# Patient Record
Sex: Female | Born: 1960 | Race: White | Hispanic: No | Smoking: Current every day smoker
Health system: Southern US, Community
[De-identification: ages and names within clinical notes are randomized; demographics above are authoritative.]

## PROBLEM LIST (undated history)

## (undated) DIAGNOSIS — T7840XA Allergy, unspecified, initial encounter: Secondary | ICD-10-CM

## (undated) DIAGNOSIS — D510 Vitamin B12 deficiency anemia due to intrinsic factor deficiency: Secondary | ICD-10-CM

## (undated) DIAGNOSIS — J302 Other seasonal allergic rhinitis: Secondary | ICD-10-CM

## (undated) DIAGNOSIS — C4491 Basal cell carcinoma of skin, unspecified: Secondary | ICD-10-CM

## (undated) HISTORY — PX: MOHS SURGERY: SHX181

## (undated) HISTORY — DX: Basal cell carcinoma of skin, unspecified: C44.91

## (undated) HISTORY — DX: Other seasonal allergic rhinitis: J30.2

## (undated) HISTORY — DX: Vitamin B12 deficiency anemia due to intrinsic factor deficiency: D51.0

## (undated) HISTORY — DX: Allergy, unspecified, initial encounter: T78.40XA

## (undated) HISTORY — PX: CHOLECYSTECTOMY: SHX55

---

## 2002-01-08 ENCOUNTER — Other Ambulatory Visit: Admission: RE | Admit: 2002-01-08 | Discharge: 2002-01-08 | Payer: Self-pay | Admitting: Obstetrics and Gynecology

## 2003-01-07 ENCOUNTER — Other Ambulatory Visit: Admission: RE | Admit: 2003-01-07 | Discharge: 2003-01-07 | Payer: Self-pay | Admitting: Internal Medicine

## 2003-01-20 ENCOUNTER — Encounter: Admission: RE | Admit: 2003-01-20 | Discharge: 2003-01-20 | Payer: Self-pay | Admitting: Internal Medicine

## 2003-01-20 ENCOUNTER — Encounter: Payer: Self-pay | Admitting: Internal Medicine

## 2004-08-04 ENCOUNTER — Ambulatory Visit: Payer: Self-pay | Admitting: Internal Medicine

## 2004-08-05 ENCOUNTER — Ambulatory Visit: Payer: Self-pay | Admitting: *Deleted

## 2004-08-11 ENCOUNTER — Ambulatory Visit (HOSPITAL_COMMUNITY): Admission: RE | Admit: 2004-08-11 | Discharge: 2004-08-11 | Payer: Self-pay | Admitting: Family Medicine

## 2004-08-26 ENCOUNTER — Encounter: Admission: RE | Admit: 2004-08-26 | Discharge: 2004-08-26 | Payer: Self-pay | Admitting: Family Medicine

## 2004-08-26 ENCOUNTER — Ambulatory Visit: Payer: Self-pay | Admitting: Internal Medicine

## 2004-09-05 ENCOUNTER — Ambulatory Visit (HOSPITAL_COMMUNITY): Admission: RE | Admit: 2004-09-05 | Discharge: 2004-09-05 | Payer: Self-pay | Admitting: Internal Medicine

## 2004-09-05 ENCOUNTER — Ambulatory Visit: Payer: Self-pay | Admitting: Internal Medicine

## 2004-09-13 ENCOUNTER — Encounter (INDEPENDENT_AMBULATORY_CARE_PROVIDER_SITE_OTHER): Payer: Self-pay | Admitting: *Deleted

## 2004-09-13 ENCOUNTER — Ambulatory Visit: Payer: Self-pay | Admitting: *Deleted

## 2004-09-14 ENCOUNTER — Ambulatory Visit: Payer: Self-pay | Admitting: Internal Medicine

## 2004-09-22 ENCOUNTER — Ambulatory Visit: Payer: Self-pay | Admitting: Obstetrics and Gynecology

## 2006-04-06 ENCOUNTER — Encounter: Admission: RE | Admit: 2006-04-06 | Discharge: 2006-04-06 | Payer: Self-pay | Admitting: Obstetrics and Gynecology

## 2006-08-20 ENCOUNTER — Encounter: Admission: RE | Admit: 2006-08-20 | Discharge: 2006-08-20 | Payer: Self-pay | Admitting: Obstetrics and Gynecology

## 2007-03-26 ENCOUNTER — Encounter: Admission: RE | Admit: 2007-03-26 | Discharge: 2007-03-26 | Payer: Self-pay | Admitting: Obstetrics and Gynecology

## 2007-10-02 ENCOUNTER — Ambulatory Visit: Payer: Self-pay | Admitting: Internal Medicine

## 2007-10-24 ENCOUNTER — Ambulatory Visit (HOSPITAL_COMMUNITY): Admission: RE | Admit: 2007-10-24 | Discharge: 2007-10-24 | Payer: Self-pay | Admitting: Internal Medicine

## 2007-11-25 ENCOUNTER — Encounter: Admission: RE | Admit: 2007-11-25 | Discharge: 2007-11-25 | Payer: Self-pay | Admitting: Family Medicine

## 2008-04-07 ENCOUNTER — Encounter: Admission: RE | Admit: 2008-04-07 | Discharge: 2008-04-07 | Payer: Self-pay | Admitting: Obstetrics and Gynecology

## 2008-06-05 HISTORY — PX: BREAST BIOPSY: SHX20

## 2009-04-08 ENCOUNTER — Encounter: Admission: RE | Admit: 2009-04-08 | Discharge: 2009-04-08 | Payer: Self-pay | Admitting: Obstetrics and Gynecology

## 2009-04-13 ENCOUNTER — Encounter: Admission: RE | Admit: 2009-04-13 | Discharge: 2009-04-13 | Payer: Self-pay | Admitting: Obstetrics and Gynecology

## 2009-07-01 ENCOUNTER — Ambulatory Visit: Payer: Self-pay | Admitting: Internal Medicine

## 2009-07-05 LAB — CBC & DIFF AND RETIC
BASO%: 0.3 % (ref 0.0–2.0)
Eosinophils Absolute: 0.2 10*3/uL (ref 0.0–0.5)
LYMPH%: 35.6 % (ref 14.0–49.7)
MCHC: 33.5 g/dL (ref 31.5–36.0)
MCV: 101 fL (ref 79.5–101.0)
MONO%: 5.6 % (ref 0.0–14.0)
NEUT#: 3.3 10*3/uL (ref 1.5–6.5)
RBC: 3.81 10*6/uL (ref 3.70–5.45)
RDW: 12.9 % (ref 11.2–14.5)
Retic %: 0.7 % (ref 0.50–1.50)
Retic Ct Abs: 26.67 10*3/uL (ref 18.30–72.70)
WBC: 5.9 10*3/uL (ref 3.9–10.3)

## 2009-07-05 LAB — IRON AND TIBC: UIBC: 169 ug/dL

## 2009-07-05 LAB — FERRITIN: Ferritin: 22 ng/mL (ref 10–291)

## 2009-07-05 LAB — VITAMIN B12: Vitamin B-12: 972 pg/mL — ABNORMAL HIGH (ref 211–911)

## 2009-07-05 LAB — FOLATE: Folate: 15.6 ng/mL

## 2009-10-29 ENCOUNTER — Encounter: Admission: RE | Admit: 2009-10-29 | Discharge: 2009-10-29 | Payer: Self-pay | Admitting: Obstetrics and Gynecology

## 2010-05-20 ENCOUNTER — Encounter
Admission: RE | Admit: 2010-05-20 | Discharge: 2010-05-20 | Payer: Self-pay | Source: Home / Self Care | Attending: Obstetrics and Gynecology | Admitting: Obstetrics and Gynecology

## 2010-06-26 ENCOUNTER — Encounter: Payer: Self-pay | Admitting: Obstetrics and Gynecology

## 2010-06-26 ENCOUNTER — Encounter: Payer: Self-pay | Admitting: Family Medicine

## 2010-08-11 ENCOUNTER — Other Ambulatory Visit: Payer: Self-pay | Admitting: Dermatology

## 2010-10-21 NOTE — Group Therapy Note (Signed)
NAMEKIANTE, PETROVICH NO.:  [id number]   MEDICAL RECORD NO.:  [id number]          PATIENT TYPE:  WOC   LOCATION:  [facility]                   FACILITY:  [facility]   PHYSICIAN:  Ellis Parents, MD    DATE OF BIRTH:  [birth year]   DATE OF SERVICE:  09/13/2004                                    CLINIC NOTE   NOTE:  This 50 year old gravida 2, para 2 with last menstrual period on  August 28, 2004, comes in to have an IDU removed and a new one reinserted,  and for a Pap smear.  The patient's menses are regular, lasting  approximately six days and a normal flow.  She has no gynecological  complaints.  She has had the IUD in for 10 years, and has had no difficulty.   She had a mammogram approximately one month ago.   PHYSICAL EXAMINATION:  PELVIC:  The external genitalia are normal.  The  vagina is clean.  The cervix is well-epithelialized and appears normal.  The  IUD thread is seen protruding approximately 3 cm.  The uterus is in mid-  position, normal in size and mobile.  Both adnexa are soft, without any  evidence of pelvic masses or nodularity.  A Pap smear is performed.   The patient was advised to return in the onset and during the duration of  the next menstrual period for the IUD to be removed and an Arena to be  inserted.      SA/MEDQ  D:  09/13/2004  T:  09/13/2004  Job:  [id number]

## 2011-05-24 ENCOUNTER — Other Ambulatory Visit: Payer: Self-pay | Admitting: Obstetrics and Gynecology

## 2011-06-25 ENCOUNTER — Encounter (INDEPENDENT_AMBULATORY_CARE_PROVIDER_SITE_OTHER): Payer: BC Managed Care – PPO | Admitting: Family Medicine

## 2011-06-25 DIAGNOSIS — M25579 Pain in unspecified ankle and joints of unspecified foot: Secondary | ICD-10-CM

## 2011-06-25 DIAGNOSIS — I10 Essential (primary) hypertension: Secondary | ICD-10-CM

## 2011-06-25 DIAGNOSIS — R5382 Chronic fatigue, unspecified: Secondary | ICD-10-CM

## 2011-06-27 ENCOUNTER — Encounter: Payer: Self-pay | Admitting: Family Medicine

## 2011-06-27 DIAGNOSIS — D510 Vitamin B12 deficiency anemia due to intrinsic factor deficiency: Secondary | ICD-10-CM | POA: Insufficient documentation

## 2011-06-27 DIAGNOSIS — C4491 Basal cell carcinoma of skin, unspecified: Secondary | ICD-10-CM | POA: Insufficient documentation

## 2011-06-27 DIAGNOSIS — J302 Other seasonal allergic rhinitis: Secondary | ICD-10-CM | POA: Insufficient documentation

## 2011-06-29 NOTE — Progress Notes (Deleted)
  Subjective:    Patient ID: Renee Anderson, female    DOB: 04-25-1961, 51 y.o.   MRN: [id number]  HPI    Review of Systems     Objective:   Physical Exam  Constitutional: She appears well-developed.  Pulmonary/Chest: Effort normal.  Skin: Skin is warm.          Assessment & Plan:

## 2011-07-07 ENCOUNTER — Other Ambulatory Visit: Payer: Self-pay | Admitting: Obstetrics and Gynecology

## 2011-07-07 DIAGNOSIS — Z1231 Encounter for screening mammogram for malignant neoplasm of breast: Secondary | ICD-10-CM

## 2011-07-21 ENCOUNTER — Ambulatory Visit
Admission: RE | Admit: 2011-07-21 | Discharge: 2011-07-21 | Disposition: A | Discharge status: Home / Self Care | Payer: Self-pay | Source: Ambulatory Visit | Attending: Obstetrics and Gynecology | Admitting: Obstetrics and Gynecology

## 2011-07-21 DIAGNOSIS — Z1231 Encounter for screening mammogram for malignant neoplasm of breast: Secondary | ICD-10-CM

## 2011-07-26 NOTE — Progress Notes (Signed)
This encounter was created in error - please disregard.

## 2011-10-10 ENCOUNTER — Ambulatory Visit (INDEPENDENT_AMBULATORY_CARE_PROVIDER_SITE_OTHER): Payer: BC Managed Care – PPO | Admitting: Family Medicine

## 2011-10-10 ENCOUNTER — Other Ambulatory Visit: Payer: Self-pay | Admitting: Family Medicine

## 2011-10-10 VITALS — BP 103/65 | HR 66 | Temp 98.0°F | Resp 16 | Ht 65.5 in | Wt 177.0 lb

## 2011-10-10 DIAGNOSIS — D510 Vitamin B12 deficiency anemia due to intrinsic factor deficiency: Secondary | ICD-10-CM

## 2011-10-10 DIAGNOSIS — M545 Low back pain: Secondary | ICD-10-CM

## 2011-10-10 LAB — POCT URINALYSIS DIPSTICK
Leukocytes, UA: NEGATIVE
Nitrite, UA: NEGATIVE
Protein, UA: NEGATIVE
Urobilinogen, UA: 0.2
pH, UA: 6

## 2011-10-10 LAB — POCT UA - MICROSCOPIC ONLY
Casts, Ur, LPF, POC: NEGATIVE
Mucus, UA: NEGATIVE

## 2011-10-10 MED ORDER — CYANOCOBALAMIN 1000 MCG/ML IJ SOLN
1000.0000 ug | Freq: Once | INTRAMUSCULAR | Status: AC
  Administered 2011-10-10: 1000 ug via INTRAMUSCULAR

## 2011-10-10 NOTE — Progress Notes (Signed)
  Subjective:    Patient ID: Renee Anderson, female    DOB: [birth year], 51 y.o.   MRN: [id number]  HPI  Patient presents complaining of LBP that is positional in nature. Denies radiation to LE or change in bowel or bladder function  Patient would like urine checked as she's concerned she may have a UTI. No genitourinary symptoms  Requests monthly B12 injection; known pernicious anemia  Review of Systems     Objective:   Physical Exam  Constitutional: She appears well-developed.  Cardiovascular: Normal rate, regular rhythm and normal heart sounds.   Pulmonary/Chest: Effort normal and breath sounds normal.  Abdominal: Soft. Bowel sounds are normal. There is no tenderness.  Musculoskeletal:       Lumbar back: She exhibits tenderness and spasm. She exhibits normal range of motion, no bony tenderness and no pain (neg SLR).  Skin: Skin is warm.     Results for orders placed in visit on 10/10/11  POCT UA - MICROSCOPIC ONLY      Component Value Range   WBC, Ur, HPF, POC 1-5     RBC, urine, microscopic neg     Bacteria, U Microscopic 1+     Mucus, UA neg     Epithelial cells, urine per micros 0-4     Crystals, Ur, HPF, POC neg     Casts, Ur, LPF, POC neg     Yeast, UA neg    POCT URINALYSIS DIPSTICK      Component Value Range   Color, UA yellow     Clarity, UA clear     Glucose, UA neg     Bilirubin, UA neg     Ketones, UA neg     Spec Grav, UA 1.020     Blood, UA neg     pH, UA 6.0     Protein, UA neg     Urobilinogen, UA 0.2     Nitrite, UA neg     Leukocytes, UA Negative          Assessment & Plan:   1. Low back pain  POCT UA - Microscopic Only, POCT Urinalysis Dipstick, Urine culture  2. Pernicious anemia  cyanocobalamin ((VITAMIN B-12)) injection 1,000 mcg    Mobic 7.5 mg BID; has Rx at home Anticipatory guidance

## 2011-10-24 ENCOUNTER — Other Ambulatory Visit: Payer: Self-pay | Admitting: Family Medicine

## 2011-12-22 ENCOUNTER — Other Ambulatory Visit: Payer: Self-pay | Admitting: Obstetrics and Gynecology

## 2011-12-22 DIAGNOSIS — N63 Unspecified lump in unspecified breast: Secondary | ICD-10-CM

## 2011-12-27 ENCOUNTER — Ambulatory Visit
Admission: RE | Admit: 2011-12-27 | Discharge: 2011-12-27 | Disposition: A | Discharge status: Home / Self Care | Payer: BC Managed Care – PPO | Source: Ambulatory Visit | Attending: Obstetrics and Gynecology | Admitting: Obstetrics and Gynecology

## 2011-12-27 DIAGNOSIS — N63 Unspecified lump in unspecified breast: Secondary | ICD-10-CM

## 2012-01-25 ENCOUNTER — Other Ambulatory Visit: Payer: Self-pay | Admitting: Family Medicine

## 2012-01-25 NOTE — Telephone Encounter (Signed)
Patient's chart is at the nurses station in the pa pool pile. °

## 2012-02-26 ENCOUNTER — Ambulatory Visit (INDEPENDENT_AMBULATORY_CARE_PROVIDER_SITE_OTHER): Payer: BC Managed Care – PPO | Admitting: Family Medicine

## 2012-02-26 VITALS — BP 136/78 | HR 79 | Temp 97.6°F | Resp 16 | Ht 64.0 in | Wt 171.8 lb

## 2012-02-26 DIAGNOSIS — J3489 Other specified disorders of nose and nasal sinuses: Secondary | ICD-10-CM

## 2012-02-26 DIAGNOSIS — R51 Headache: Secondary | ICD-10-CM

## 2012-02-26 MED ORDER — AZITHROMYCIN 250 MG PO TABS: ORAL_TABLET | ORAL | Status: DC

## 2012-02-26 NOTE — Progress Notes (Signed)
  Subjective:    Patient ID: Renee Anderson, female    DOB: 19-Sep-1960, 51 y.o.   MRN: [id number]  HPI Renee Anderson is a 51 y.o. female Started with congestion fatigue 2 days ago.  Headache yesterday behind L eye. Worse today. Headache back and forth.  Tried zyrtec and ibuprofen today - zyrtec as needed only in past.    No fever.  No congestion yet, no sore throat, but feels pnd.  No chest tightness or cough "yet".    Review of Systems  Constitutional: Negative for fever and chills.  HENT: Positive for sinus pressure.   Respiratory: Negative for cough and shortness of breath.   Cardiovascular: Negative for chest pain.  Neurological: Positive for headaches.       Objective:   Physical Exam  Constitutional: She is oriented to person, place, and time. She appears well-developed and well-nourished. No distress.  HENT:  Head: Normocephalic and atraumatic.  Right Ear: Hearing, tympanic membrane, external ear and ear canal normal.  Left Ear: Hearing, tympanic membrane, external ear and ear canal normal.  Nose: Nose normal.  Mouth/Throat: Oropharynx is clear and moist. No oropharyngeal exudate.  Eyes: Conjunctivae normal and EOM are normal. Pupils are equal, round, and reactive to light.  Cardiovascular: Normal rate, regular rhythm, normal heart sounds and intact distal pulses.   No murmur heard. Pulmonary/Chest: Effort normal and breath sounds normal. No respiratory distress. She has no wheezes. She has no rhonchi.  Neurological: She is alert and oriented to person, place, and time.  Skin: Skin is warm and dry. No rash noted.  Psychiatric: She has a normal mood and affect. Her behavior is normal.       Assessment & Plan:  Renee Anderson is a 51 y.o. female 1. Headache  azithromycin (ZITHROMAX) 250 MG tablet  2. Sinus pain  azithromycin (ZITHROMAX) 250 MG tablet   Discussed likely early viral uri, sinus pressure.  Z pak requested, discussed use and indications for  antiobiotics, and unlikely sinus infection at this point.  Can continue zyrtec, start saline ns, and if not improving this week - start z pak.  If any worsening - rtc.  rtc precautions discussed.

## 2012-02-26 NOTE — Patient Instructions (Signed)
Saline nasal spray atleast 4 times per day,  drink plenty of fluids. If not improving this week, can fill z pak prescription. Return to the clinic or go to the nearest emergency room if any of your symptoms worsen or new symptoms occur.

## 2012-04-25 ENCOUNTER — Other Ambulatory Visit: Payer: Self-pay | Admitting: *Deleted

## 2012-04-25 NOTE — Telephone Encounter (Signed)
Pharmacy requesting refills on b12.  Last filled on 01/25/12

## 2012-04-30 ENCOUNTER — Other Ambulatory Visit: Payer: Self-pay | Admitting: Physician Assistant

## 2012-04-30 MED ORDER — CYANOCOBALAMIN 1000 MCG/ML IJ SOLN: 1000.0000 ug | INTRAMUSCULAR | Status: AC

## 2012-05-20 ENCOUNTER — Other Ambulatory Visit: Payer: Self-pay | Admitting: Radiology

## 2012-07-22 ENCOUNTER — Other Ambulatory Visit: Payer: Self-pay | Admitting: Dermatology

## 2012-11-19 ENCOUNTER — Other Ambulatory Visit: Payer: Self-pay

## 2012-11-19 DIAGNOSIS — Z1231 Encounter for screening mammogram for malignant neoplasm of breast: Secondary | ICD-10-CM

## 2012-12-23 ENCOUNTER — Ambulatory Visit
Admission: RE | Admit: 2012-12-23 | Discharge: 2012-12-23 | Disposition: A | Discharge status: Home / Self Care | Payer: BC Managed Care – PPO | Source: Ambulatory Visit

## 2012-12-23 ENCOUNTER — Ambulatory Visit: Payer: BC Managed Care – PPO

## 2012-12-23 DIAGNOSIS — Z1231 Encounter for screening mammogram for malignant neoplasm of breast: Secondary | ICD-10-CM

## 2013-01-27 ENCOUNTER — Other Ambulatory Visit: Payer: Self-pay | Admitting: Dermatology

## 2013-12-08 ENCOUNTER — Other Ambulatory Visit: Payer: Self-pay

## 2013-12-08 DIAGNOSIS — Z1231 Encounter for screening mammogram for malignant neoplasm of breast: Secondary | ICD-10-CM

## 2013-12-26 ENCOUNTER — Ambulatory Visit: Payer: BC Managed Care – PPO

## 2013-12-30 ENCOUNTER — Ambulatory Visit
Admission: RE | Admit: 2013-12-30 | Discharge: 2013-12-30 | Disposition: A | Discharge status: Home / Self Care | Payer: BC Managed Care – PPO | Source: Ambulatory Visit

## 2013-12-30 DIAGNOSIS — Z1231 Encounter for screening mammogram for malignant neoplasm of breast: Secondary | ICD-10-CM

## 2014-09-25 ENCOUNTER — Other Ambulatory Visit: Payer: Self-pay | Admitting: Obstetrics and Gynecology

## 2014-09-29 LAB — CYTOLOGY - PAP

## 2015-04-07 IMAGING — MG MM DIGITAL SCREENING BILAT
5 series · 5 of 5 positions shown · non-contrast
Comparison: Previous exam(s).

CLINICAL DATA: Screening.

DIGITAL SCREENING BILATERAL MAMMOGRAM WITH CAD

[R CC]
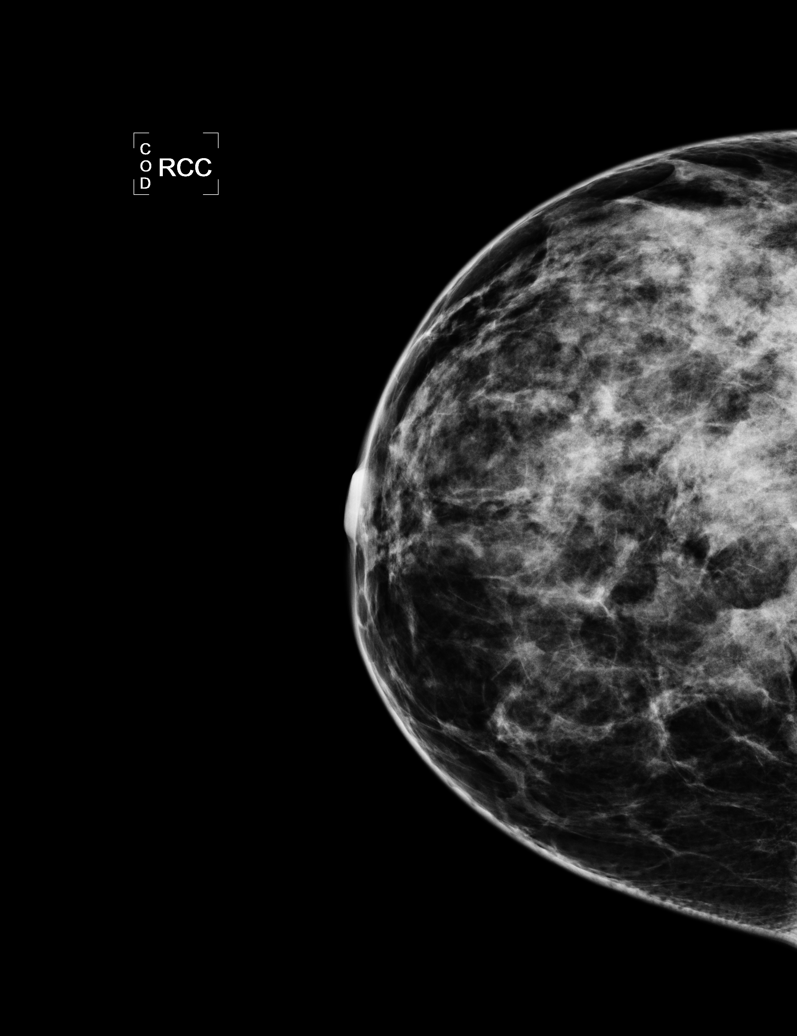

[L CC (1 of 2)]
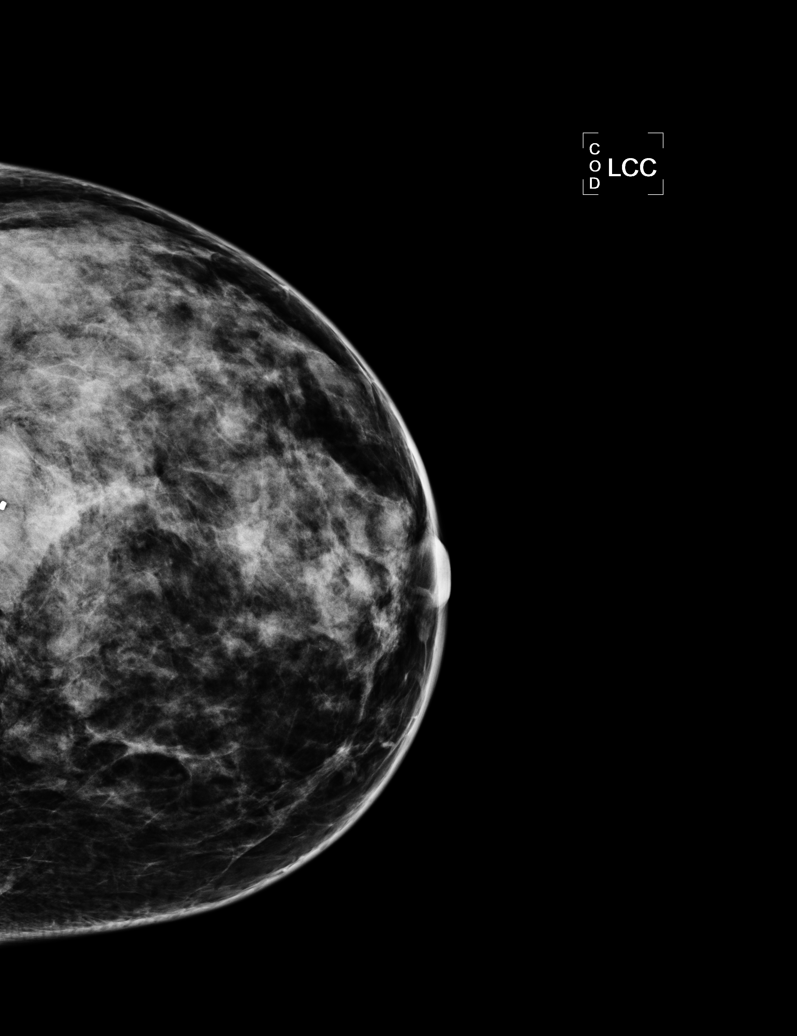

[L MLO]
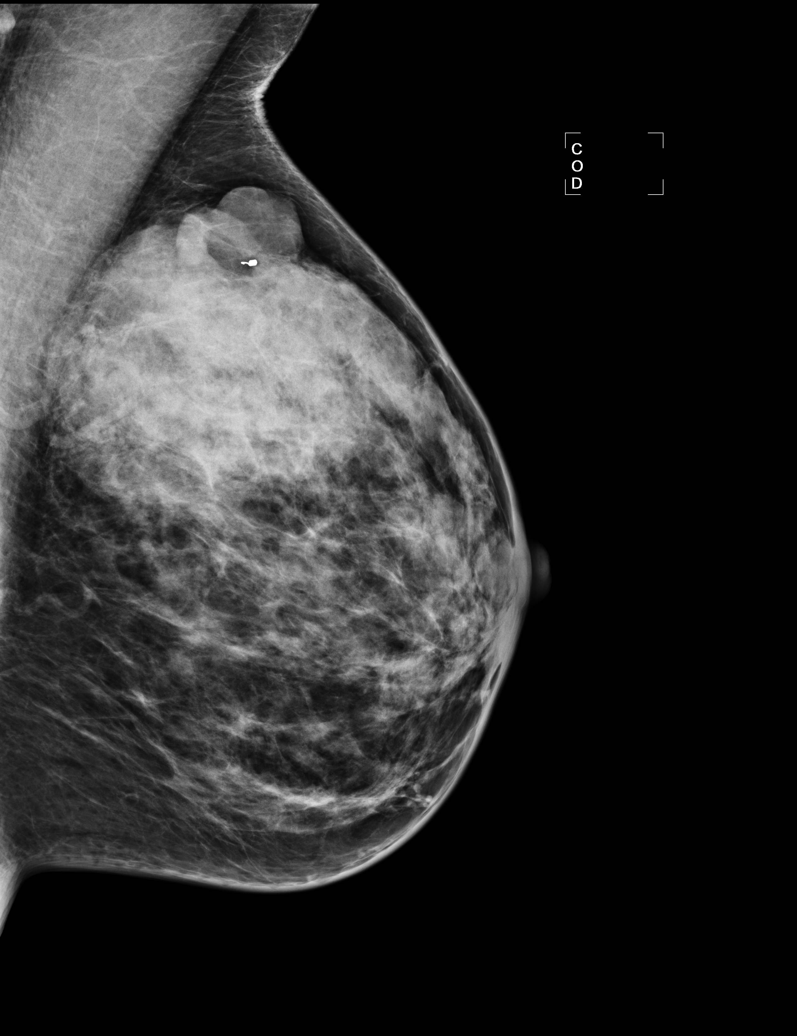

[R MLO]
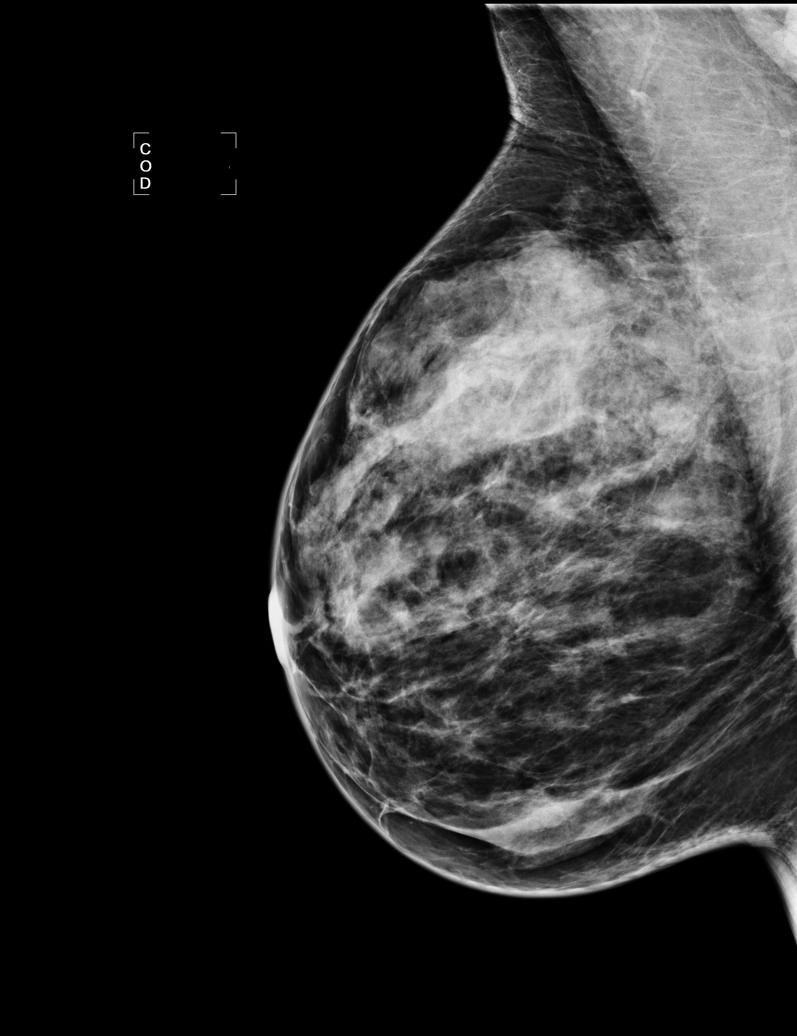

[L CC (2 of 2)]
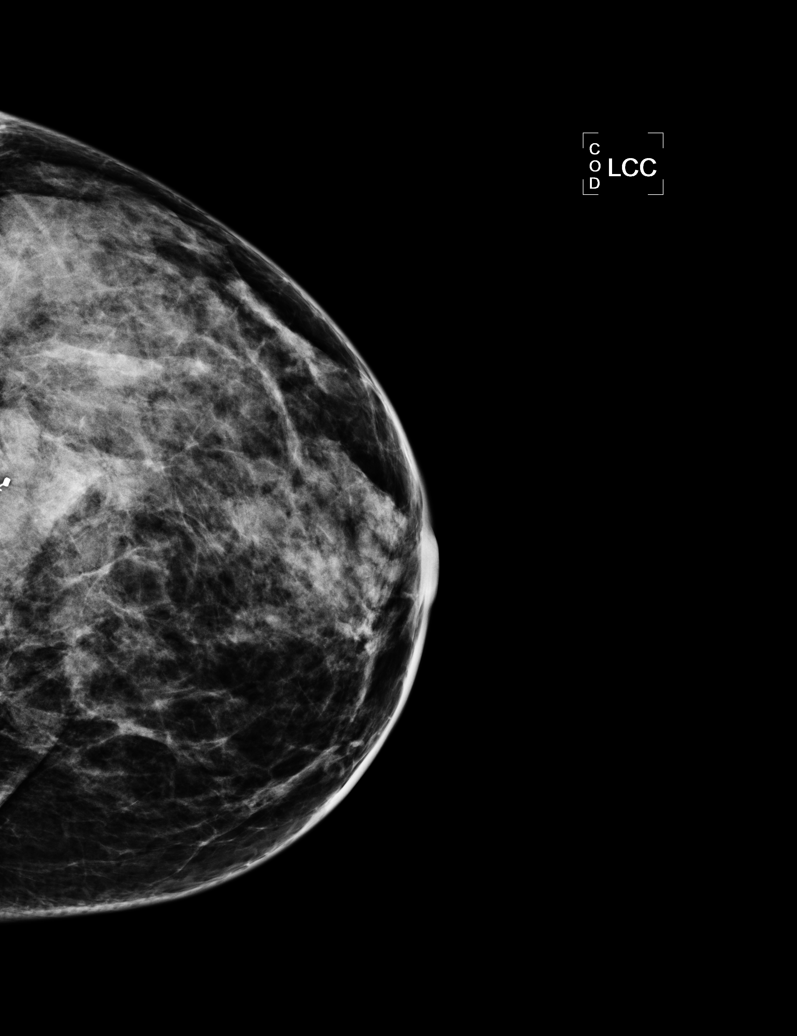

[5 of 5 positions shown; findings below may reference images not displayed]

FINDINGS: ACR Breast Density Category d:  The breasts tissue is extremely
dense, which lowers the sensitivity of mammography.

There are no findings suspicious for malignancy.

Images were processed with CAD.
IMPRESSION: No mammographic evidence of malignancy.

A result letter of this screening mammogram will be mailed directly
to the patient.

RECOMMENDATION:
Screening mammogram in one year. (Code:MR-H-GZK)

BI-RADS CATEGORY 2:  Benign finding(s).

## 2015-04-14 ENCOUNTER — Other Ambulatory Visit: Payer: Self-pay

## 2015-04-14 DIAGNOSIS — Z1231 Encounter for screening mammogram for malignant neoplasm of breast: Secondary | ICD-10-CM

## 2015-05-06 ENCOUNTER — Ambulatory Visit
Admission: RE | Admit: 2015-05-06 | Discharge: 2015-05-06 | Disposition: A | Discharge status: Home / Self Care | Payer: BLUE CROSS/BLUE SHIELD | Source: Ambulatory Visit

## 2015-05-06 DIAGNOSIS — Z1231 Encounter for screening mammogram for malignant neoplasm of breast: Secondary | ICD-10-CM

## 2015-08-02 ENCOUNTER — Ambulatory Visit (INDEPENDENT_AMBULATORY_CARE_PROVIDER_SITE_OTHER): Payer: BLUE CROSS/BLUE SHIELD

## 2015-08-02 ENCOUNTER — Ambulatory Visit (INDEPENDENT_AMBULATORY_CARE_PROVIDER_SITE_OTHER): Payer: BLUE CROSS/BLUE SHIELD | Admitting: Physician Assistant

## 2015-08-02 VITALS — BP 116/76 | HR 66 | Temp 97.8°F | Resp 16 | Ht 64.0 in | Wt 196.0 lb

## 2015-08-02 DIAGNOSIS — S99922A Unspecified injury of left foot, initial encounter: Secondary | ICD-10-CM | POA: Diagnosis not present

## 2015-08-02 DIAGNOSIS — S92912A Unspecified fracture of left toe(s), initial encounter for closed fracture: Secondary | ICD-10-CM

## 2015-08-02 NOTE — Progress Notes (Signed)
[facility] [address], [place] [address] [phone]  Date:  08/02/2015   Name:  Renee Anderson   DOB:  1960-06-09   MRN:  [id number]  PCP:  Marijean Bravo, MD    History of Present Illness:  Renee Anderson is a 55 y.o. female patient who presents to [facility] for chief complaint of toe pain of left foot.    Left foot pain that started 2 days ago, after she split her 4th and 5th toe between the bottom of her couch.  She then hit it on a flower pot in her house the same day.  She has noted swelling, and bruising across the top of her foot.  She has had swelling along the entirety of the foot at this time.  No ankle pain.  5th toe "feels kind of numb".   Patient Active Problem List   Diagnosis Date Noted  . Pernicious anemia   . Basal cell carcinoma   . Seasonal allergies     Past Medical History  Diagnosis Date  . Pernicious anemia   . Allergy   . Basal cell carcinoma   . Seasonal allergies     Past Surgical History  Procedure Laterality Date  . Mohs surgery    . Cholecystectomy    . Cesarean section      Social History  Substance Use Topics  . Smoking status: Current Every Day Smoker -- 1.00 packs/day for 37 years  . Smokeless tobacco: None  . Alcohol Use: None    No family history on file.  No Known Allergies  Medication list has been reviewed and updated.  Current Outpatient Prescriptions on File Prior to Visit  Medication Sig Dispense Refill  . cyanocobalamin (,VITAMIN B-12,) 1000 MCG/ML injection Inject 1 mL (1,000 mcg total) into the muscle every 14 (fourteen) days. 10 mL 0  . Cyanocobalamin (VITAMIN B-12 IJ) Inject 100 mcg as directed every 30 (thirty) days.    . cyanocobalamin 100 MCG tablet Take 100 mcg by mouth daily.    . furosemide (LASIX) 10 MG/ML solution Take by mouth as needed.    . prenatal vitamin w/FE, FA (PRENATAL 1 + 1) 27-1 MG TABS TAKE ONE TABLET BY MOUTH EVERY DAY 90 each 3   No current facility-administered  medications on file prior to visit.    ROS ROS otherwise unremarkable unless listed above.   Physical Examination: BP 116/76 mmHg  Pulse 66  Temp(Src) 97.8 F (36.6 C) (Oral)  Resp 16  Ht 5\' 4"  (1.626 m)  Wt 196 lb (88.905 kg)  BMI 33.63 kg/m2  SpO2 96%  LMP 08/02/2015 Ideal Body Weight: Weight in (lb) to have BMI = 25: 145.3  Physical Exam  Constitutional: She is oriented to person, place, and time. She appears well-developed and well-nourished. No distress.  HENT:  Head: Normocephalic and atraumatic.  Right Ear: External ear normal.  Left Ear: External ear normal.  Eyes: Conjunctivae and EOM are normal. Pupils are equal, round, and reactive to light.  Cardiovascular: Normal rate.   Pulmonary/Chest: Effort normal. No respiratory distress.  Musculoskeletal:       Left ankle: Normal. Achilles tendon exhibits no pain and normal Thompson's test results.  Swelling along the dorsum of foot.  Ecchymosis present at the 5th toe and base of 5th extending to the 4th.  Tender upon palpation of the 5th toe PIP and with squeezing. Pain with dorsiflexion of 5th toe.     Neurological:  She is alert and oriented to person, place, and time.  Skin: She is not diaphoretic.  Psychiatric: She has a normal mood and affect. Her behavior is normal.    Dg Foot Complete Left  08/02/2015  CLINICAL DATA:  55 year old female with trauma to the left toe. EXAM: LEFT FOOT - COMPLETE 3+ VIEW COMPARISON:  None. FINDINGS: There is nondisplaced fracture of the base of proximal phalanx of the fifth digit. No other acute fracture identified. The bones are well mineralized. There is a 6 mm calcaneal spur. The soft tissue swelling of the forefoot predominantly over the lateral aspect of the foot. IMPRESSION: Nondisplaced fracture of the base of the proximal phalanx of the fifth digit. Electronically Signed   By: Anner Crete M.D.   On: 08/02/2015 20:14   Assessment and Plan: Renee Anderson is a 56 y.o. female  who is here today for toe pain. Buddy taped, and post-op shoe given.  Advised to keep buddy tape at all times.   Advised return in 2 weeks for re imaging and follow up.  This will require 4 weeks of post-op shoe wear.  If she contacts to be referred to ortho, this can be done as well.  She was irritated at the time of wait, and a fast track card was given to expedite her return.    Toe fracture, left, closed, initial encounter  Toe injury, left, initial encounter - Plan: DG Foot Complete Left   Ivar Drape, PA-C [facility] 08/02/2015 7:36 PM

## 2015-08-02 NOTE — Patient Instructions (Addendum)
Please keep this toe in buddy-tape.   I would like you to return in 2 weeks for follow up and to re-image.  Toe Fracture A toe fracture is a break in one of the toe bones (phalanges). HOME CARE If You Have a Cast:  Do not stick anything inside the cast to scratch your skin.  Check the skin around the cast every day. Tell your doctor about any concerns. Do not put lotion on the skin underneath the cast. You may put lotion on dry skin around the edges of the cast.  Do not put pressure on any part of the cast until it is fully hardened. This may take many hours.  Keep the cast clean and dry. Bathing  Do not take baths, swim, or use a hot tub until your doctor says that you can. Ask your doctor if you can take showers. You may only be allowed to take sponge baths for bathing.  If your doctor says that bathing and showering are okay, cover the cast or bandage (dressing) with a watertight plastic bag to protect it from water. Do not let the cast or bandage get wet. Managing Pain, Stiffness, and Swelling  If you do not have a cast, put ice on the injured area if told by your doctor:  Put ice in a plastic bag.  Place a towel between your skin and the bag.  Leave the ice on for 20 minutes, 2-3 times per day.  Move your toes often to avoid stiffness and to lessen swelling.  Raise (elevate) the injured area above the level of your heart while you are sitting or lying down. Driving  Do not drive or use heavy machinery while taking pain medicine.  Do not drive while wearing a cast on a foot that you use for driving. Activity  Return to your normal activities as told by your doctor. Ask your doctor what activities are safe for you.  Perform exercises daily as told by your doctor or therapist. Safety  Do not use your leg to support your body weight until your doctor says that you can. Use crutches or other tools to help you move around as told by your doctor. General Instructions  If  your toe was taped to a toe that is next to it (buddy taping), follow your doctor's instructions for changing the gauze and tape. Change it more often:  If the gauze and tape get wet. If this happens, dry the space between the toes.  If the gauze and tape are too tight and they cause your toe to become pale or to lose feeling (numb).  Wear a protective shoe as told by your doctor. If you were not given one, wear sturdy shoes that support your foot. Your shoes should not pinch your toes. Your shoes should not fit tightly against your toes.  Do not use any tobacco products, including cigarettes, chewing tobacco, or e-cigarettes. Tobacco can delay bone healing. If you need help quitting, ask your doctor.  Take medicines only as told by your doctor.  Keep all follow-up visits as told by your doctor. This is important. GET HELP IF:  You have a fever.  Your pain medicine is not helping.  Your toe feels cold.  You lose feeling (have numbness) in your toe.  You still have pain after one week of rest and treatment.  You still have pain after your doctor has said that you can start walking again.  You have pain or tingling in  your foot, and it is not going away.  You have loss of feeling in your foot, and it is not going away. GET HELP RIGHT AWAY IF:  You have severe pain.  You have redness or swelling (inflammation) in your toe, and it is getting worse.  You have pain or loss of feeling in your toe, and it is getting worse.  Your toe is blue.   This information is not intended to replace advice given to you by your health care provider. Make sure you discuss any questions you have with your health care provider.   Document Released: 11/08/2007 Document Revised: 10/06/2014 Document Reviewed: 03/18/2014 Elsevier Interactive Patient Education [employer].

## 2015-08-18 ENCOUNTER — Ambulatory Visit (INDEPENDENT_AMBULATORY_CARE_PROVIDER_SITE_OTHER): Payer: BLUE CROSS/BLUE SHIELD | Admitting: Physician Assistant

## 2015-08-18 ENCOUNTER — Ambulatory Visit (INDEPENDENT_AMBULATORY_CARE_PROVIDER_SITE_OTHER): Payer: BLUE CROSS/BLUE SHIELD

## 2015-08-18 VITALS — BP 132/80 | HR 61 | Temp 97.6°F | Resp 14 | Ht 64.75 in | Wt 195.0 lb

## 2015-08-18 DIAGNOSIS — S92912A Unspecified fracture of left toe(s), initial encounter for closed fracture: Secondary | ICD-10-CM | POA: Diagnosis not present

## 2015-08-18 NOTE — Progress Notes (Signed)
[facility] [address]  [place] [address] [phone]  Date:  08/18/2015   Name:  Renee Anderson   DOB:  01/05/1961   MRN:  [id number]  PCP:  Marijean Bravo, MD   Chief Complaint  Patient presents with  . Follow-up    Left foot     History of Present Illness:  Renee Anderson is a 55 y.o. female patient who presents to [facility]     Patient Active Problem List   Diagnosis Date Noted  . Pernicious anemia   . Basal cell carcinoma   . Seasonal allergies     Past Medical History  Diagnosis Date  . Pernicious anemia   . Allergy   . Basal cell carcinoma   . Seasonal allergies     Past Surgical History  Procedure Laterality Date  . Mohs surgery    . Cholecystectomy    . Cesarean section      Social History  Substance Use Topics  . Smoking status: Current Every Day Smoker -- 1.00 packs/day for 37 years  . Smokeless tobacco: None  . Alcohol Use: None    History reviewed. No pertinent family history.  No Known Allergies  Medication list has been reviewed and updated.  Current Outpatient Prescriptions on File Prior to Visit  Medication Sig Dispense Refill  . cyanocobalamin (,VITAMIN B-12,) 1000 MCG/ML injection Inject 1 mL (1,000 mcg total) into the muscle every 14 (fourteen) days. 10 mL 0  . furosemide (LASIX) 10 MG/ML solution Take by mouth as needed.    . prenatal vitamin w/FE, FA (PRENATAL 1 + 1) 27-1 MG TABS TAKE ONE TABLET BY MOUTH EVERY DAY 90 each 3  . Cyanocobalamin (VITAMIN B-12 IJ) Inject 100 mcg as directed every 30 (thirty) days. Reported on 08/18/2015    . cyanocobalamin 100 MCG tablet Take 100 mcg by mouth daily. Reported on 08/18/2015     No current facility-administered medications on file prior to visit.    ROS ROS otherwise unremarkable unless listed above.   Physical Examination: BP 132/80 mmHg  Pulse 61  Temp(Src) 97.6 F (36.4 C) (Oral)  Resp 14  Ht 5' 4.75" (1.645 m)  Wt 195 lb (88.451 kg)  BMI 32.69 kg/m2   SpO2 95%  LMP 08/02/2015 Ideal Body Weight: Weight in (lb) to have BMI = 25: 148.8  Physical Exam  Constitutional: She is oriented to person, place, and time. She appears well-developed and well-nourished. No distress.  HENT:  Head: Normocephalic and atraumatic.  Right Ear: External ear normal.  Left Ear: External ear normal.  Eyes: Conjunctivae and EOM are normal. Pupils are equal, round, and reactive to light.  Cardiovascular: Normal rate.   Pulmonary/Chest: Effort normal. No respiratory distress.  Musculoskeletal:  Left proximal phalanx tenderness with direct palpation.  Pain incited with passive plantar flexion.  No erythema or edema.    Neurological: She is alert and oriented to person, place, and time.  Skin: She is not diaphoretic.  Psychiatric: She has a normal mood and affect. Her behavior is normal.     Assessment and Plan: Renee Anderson is a 55 y.o. female who is here today for left toe fracture follow up.   --no complication.  Advised to use buddy tape, and post op shoe as instructed prior.   Toe fracture, left, closed, initial encounter - Plan: DG Toe 5th Left  Ivar Drape, PA-C [facility] 08/18/2015 6:05 PM

## 2015-08-18 NOTE — Patient Instructions (Addendum)
IF you received an x-ray today, you will receive an invoice from [facility]. Please contact [facility] at [phone] with questions or concerns regarding your invoice.   IF you received labwork today, you will receive an invoice from [employer]. Please contact [facility] at [phone] with questions or concerns regarding your invoice.   Our billing staff will not be able to assist you with questions regarding bills from these companies.  You will be contacted with the lab results as soon as they are available. The fastest way to get your results is to activate your My Chart account. Instructions are located on the last page of this paperwork. If you have not heard from [place] regarding the results in 2 weeks, please contact this office.  This looks like it is healing well.  Continue to stay buddy tape the toe as you are doing.  Be easy on the toe.  It is best to wear the foot wear post-op shoe, as this would keep the toe in place without compressing, or causing much movement.   We will see you back in 4 weeks.

## 2015-12-17 ENCOUNTER — Other Ambulatory Visit: Payer: Self-pay | Admitting: Obstetrics and Gynecology

## 2015-12-20 LAB — CYTOLOGY - PAP

## 2016-05-03 ENCOUNTER — Other Ambulatory Visit: Payer: Self-pay | Admitting: Obstetrics and Gynecology

## 2016-05-03 DIAGNOSIS — Z1231 Encounter for screening mammogram for malignant neoplasm of breast: Secondary | ICD-10-CM

## 2016-06-12 ENCOUNTER — Ambulatory Visit
Admission: RE | Admit: 2016-06-12 | Discharge: 2016-06-12 | Disposition: A | Discharge status: Home / Self Care | Payer: BLUE CROSS/BLUE SHIELD | Source: Ambulatory Visit | Attending: Obstetrics and Gynecology | Admitting: Obstetrics and Gynecology

## 2016-06-12 DIAGNOSIS — Z1231 Encounter for screening mammogram for malignant neoplasm of breast: Secondary | ICD-10-CM

## 2016-06-24 ENCOUNTER — Ambulatory Visit (INDEPENDENT_AMBULATORY_CARE_PROVIDER_SITE_OTHER): Payer: BLUE CROSS/BLUE SHIELD | Admitting: Family Medicine

## 2016-06-24 VITALS — BP 110/70 | HR 73 | Temp 98.1°F | Resp 18 | Ht 64.0 in | Wt 203.4 lb

## 2016-06-24 DIAGNOSIS — H938X2 Other specified disorders of left ear: Secondary | ICD-10-CM

## 2016-06-24 DIAGNOSIS — J320 Chronic maxillary sinusitis: Secondary | ICD-10-CM | POA: Diagnosis not present

## 2016-06-24 MED ORDER — AZITHROMYCIN 250 MG PO TABS: ORAL_TABLET | ORAL | 0 refills | Status: DC

## 2016-06-24 MED ORDER — IPRATROPIUM BROMIDE 0.03 % NA SOLN: 2.0000 | Freq: Two times a day (BID) | NASAL | 0 refills | Status: AC

## 2016-06-24 NOTE — Progress Notes (Signed)
Patient ID: Renee Anderson, female    DOB: [birth year], 56 y.o.   MRN: [id number]  PCP: Hayden Rasmussen., MD  Chief Complaint  Patient presents with  . Sinusitis    C/O sinus pressure, PND, headache since Wednesday    Subjective:  HPI 56 year old female presents for evaluation of sinus pressure, post nasal drainage, and headache x 3 days. Reports some nasal drainage w/ streaks of blood in mucus, bilateral ear pressure, left ear is worst compared to right.  Persistent headache x 3 days. Reports several weeks of nasal drainage and congestions that resolved without treatment. Denies taking any medication.   Social History   Social History  . Marital status: Single    Spouse name: N/A  . Number of children: N/A  . Years of education: N/A   Occupational History  . Not on file.   Social History Main Topics  . Smoking status: Current Every Day Smoker    Packs/day: 1.00    Years: 37.00  . Smokeless tobacco: Never Used  . Alcohol use No  . Drug use: Unknown  . Sexual activity: Not on file   Other Topics Concern  . Not on file   Social History Narrative  . No narrative on file   Review of Systems HPI   Prior to Admission medications   Medication Sig Start Date End Date Taking? Authorizing Provider  cyanocobalamin (,VITAMIN B-12,) 1000 MCG/ML injection Inject 1 mL (1,000 mcg total) into the muscle every 14 (fourteen) days. 04/30/12  Yes Eleanore E Elana Alm, PA-C  furosemide (LASIX) 10 MG/ML solution Take by mouth as needed.   Yes Historical Provider, MD  prenatal vitamin w/FE, FA (PRENATAL 1 + 1) 27-1 MG TABS TAKE ONE TABLET BY MOUTH EVERY DAY 10/24/11  Yes Argentina Donovan, PA-C    Past Medical, Surgical Family and Social History reviewed and updated.    Objective:   Today's Vitals   06/24/16 0911  BP: 110/70  Pulse: 73  Resp: 18  Temp: 98.1 F (36.7 C)  TempSrc: Oral  SpO2: 97%  Weight: 203 lb 6 oz (92.3 kg)  Height: 5\' 4"  (1.626 m)    Wt Readings from Last  3 Encounters:  06/24/16 203 lb 6 oz (92.3 kg)  08/18/15 195 lb (88.5 kg)  08/02/15 196 lb (88.9 kg)   Physical Exam  Constitutional: She is oriented to person, place, and time. She appears well-developed and well-nourished.  HENT:  Head: Normocephalic and atraumatic.  Right Ear: External ear normal.  Left Ear: External ear normal.  Nose: Mucosal edema present. Right sinus exhibits frontal sinus tenderness. Left sinus exhibits frontal sinus tenderness.  Mouth/Throat: Uvula is midline, oropharynx is clear and moist and mucous membranes are normal.  Eyes: Pupils are equal, round, and reactive to light.  Neck: Normal range of motion. Neck supple.  Cardiovascular: Normal rate, regular rhythm, normal heart sounds and intact distal pulses.   Pulmonary/Chest: Effort normal and breath sounds normal.  Musculoskeletal: Normal range of motion.  Neurological: She is alert and oriented to person, place, and time. She has normal reflexes.  Skin: Skin is warm.  Psychiatric: She has a normal mood and affect. Her behavior is normal. Judgment and thought content normal.      Assessment & Plan:  1. Maxillary sinusitis, unspecified chronicity 2. Ear fullness, left Plan: Start Azithromycin Take 2 tabs x 1 dose, then 1 tab every day for x 4 days.  Ipratropium (Atrovent) 2 sprays, twice daily as needed  for nasal congestion.  Carroll Sage. Kenton Kingfisher, MSN, FNP-C [facility]

## 2016-06-24 NOTE — Patient Instructions (Addendum)
Start Azithromycin Take 2 tabs x 1 dose, then 1 tab every day for x 4 days.   Ipratropium (Atrovent) 2 sprays, twice daily as needed for nasal congestion.  IF you received an x-ray today, you will receive an invoice from [facility]. Please contact [facility] at [phone] with questions or concerns regarding your invoice.   IF you received labwork today, you will receive an invoice from [place]. Please contact [facility] at [phone] with questions or concerns regarding your invoice.   Our billing staff will not be able to assist you with questions regarding bills from these companies.  You will be contacted with the lab results as soon as they are available. The fastest way to get your results is to activate your My Chart account. Instructions are located on the last page of this paperwork. If you have not heard from [place] regarding the results in 2 weeks, please contact this office.     Sinusitis, Adult Sinusitis is soreness and inflammation of your sinuses. Sinuses are hollow spaces in the bones around your face. They are located:  Around your eyes.  In the middle of your forehead.  Behind your nose.  In your cheekbones. Your sinuses and nasal passages are lined with a stringy fluid (mucus). Mucus normally drains out of your sinuses. When your nasal tissues get inflamed or swollen, the mucus can get trapped or blocked so air cannot flow through your sinuses. This lets bacteria, viruses, and funguses grow, and that leads to infection. Follow these instructions at home: Medicines  Take, use, or apply over-the-counter and prescription medicines only as told by your doctor. These may include nasal sprays.  If you were prescribed an antibiotic medicine, take it as told by your doctor. Do not stop taking the antibiotic even if you start to feel better. Hydrate and Humidify  Drink enough water to keep your pee (urine) clear or pale yellow.  Use a cool mist  humidifier to keep the humidity level in your home above 50%.  Breathe in steam for 10-15 minutes, 3-4 times a day or as told by your doctor. You can do this in the bathroom while a hot shower is running.  Try not to spend time in cool or dry air. Rest  Rest as much as possible.  Sleep with your head raised (elevated).  Make sure to get enough sleep each night. General instructions  Put a warm, moist washcloth on your face 3-4 times a day or as told by your doctor. This will help with discomfort.  Wash your hands often with soap and water. If there is no soap and water, use hand sanitizer.  Do not smoke. Avoid being around people who are smoking (secondhand smoke).  Keep all follow-up visits as told by your doctor. This is important. Contact a doctor if:  You have a fever.  Your symptoms get worse.  Your symptoms do not get better within 10 days. Get help right away if:  You have a very bad headache.  You cannot stop throwing up (vomiting).  You have pain or swelling around your face or eyes.  You have trouble seeing.  You feel confused.  Your neck is stiff.  You have trouble breathing. This information is not intended to replace advice given to you by your health care provider. Make sure you discuss any questions you have with your health care provider. Document Released: 11/08/2007 Document Revised: 01/16/2016 Document Reviewed: 03/17/2015 Elsevier Interactive Patient Education  2017 [employer].

## 2016-09-14 ENCOUNTER — Ambulatory Visit (INDEPENDENT_AMBULATORY_CARE_PROVIDER_SITE_OTHER): Payer: BLUE CROSS/BLUE SHIELD | Admitting: Physician Assistant

## 2016-09-14 VITALS — BP 126/78 | HR 67 | Temp 98.5°F | Resp 18 | Ht 64.0 in | Wt 202.2 lb

## 2016-09-14 DIAGNOSIS — R5383 Other fatigue: Secondary | ICD-10-CM

## 2016-09-14 DIAGNOSIS — J019 Acute sinusitis, unspecified: Secondary | ICD-10-CM | POA: Diagnosis not present

## 2016-09-14 MED ORDER — GUAIFENESIN ER 1200 MG PO TB12: 1.0000 | ORAL_TABLET | Freq: Two times a day (BID) | ORAL | 1 refills | Status: DC | PRN

## 2016-09-14 MED ORDER — FLUTICASONE PROPIONATE 50 MCG/ACT NA SUSP: 2.0000 | Freq: Every day | NASAL | 12 refills | Status: DC

## 2016-09-14 MED ORDER — CETIRIZINE HCL 10 MG PO TABS: 10.0000 mg | ORAL_TABLET | Freq: Every day | ORAL | 1 refills | Status: DC

## 2016-09-14 NOTE — Patient Instructions (Addendum)
Continue to hydrate well with water.  If you are having failed improvement of your symptoms after 7 days, call the office.    Sinusitis, Adult Sinusitis is soreness and inflammation of your sinuses. Sinuses are hollow spaces in the bones around your face. They are located:  Around your eyes.  In the middle of your forehead.  Behind your nose.  In your cheekbones. Your sinuses and nasal passages are lined with a stringy fluid (mucus). Mucus normally drains out of your sinuses. When your nasal tissues get inflamed or swollen, the mucus can get trapped or blocked so air cannot flow through your sinuses. This lets bacteria, viruses, and funguses grow, and that leads to infection. Follow these instructions at home: Medicines   Take, use, or apply over-the-counter and prescription medicines only as told by your doctor. These may include nasal sprays.  If you were prescribed an antibiotic medicine, take it as told by your doctor. Do not stop taking the antibiotic even if you start to feel better. Hydrate and Humidify   Drink enough water to keep your pee (urine) clear or pale yellow.  Use a cool mist humidifier to keep the humidity level in your home above 50%.  Breathe in steam for 10-15 minutes, 3-4 times a day or as told by your doctor. You can do this in the bathroom while a hot shower is running.  Try not to spend time in cool or dry air. Rest   Rest as much as possible.  Sleep with your head raised (elevated).  Make sure to get enough sleep each night. General instructions   Put a warm, moist washcloth on your face 3-4 times a day or as told by your doctor. This will help with discomfort.  Wash your hands often with soap and water. If there is no soap and water, use hand sanitizer.  Do not smoke. Avoid being around people who are smoking (secondhand smoke).  Keep all follow-up visits as told by your doctor. This is important. Contact a doctor if:  You have a  fever.  Your symptoms get worse.  Your symptoms do not get better within 10 days. Get help right away if:  You have a very bad headache.  You cannot stop throwing up (vomiting).  You have pain or swelling around your face or eyes.  You have trouble seeing.  You feel confused.  Your neck is stiff.  You have trouble breathing. This information is not intended to replace advice given to you by your health care provider. Make sure you discuss any questions you have with your health care provider. Document Released: 11/08/2007 Document Revised: 01/16/2016 Document Reviewed: 03/17/2015 Elsevier Interactive Patient Education  2017 [employer].    IF you received an x-ray today, you will receive an invoice from [facility]. Please contact [facility] at [phone] with questions or concerns regarding your invoice.   IF you received labwork today, you will receive an invoice from [place]. Please contact [facility] at [phone] with questions or concerns regarding your invoice.   Our billing staff will not be able to assist you with questions regarding bills from these companies.  You will be contacted with the lab results as soon as they are available. The fastest way to get your results is to activate your My Chart account. Instructions are located on the last page of this paperwork. If you have not heard from [place] regarding the results in 2 weeks, please contact this office.

## 2016-09-14 NOTE — Progress Notes (Signed)
PRIMARY CARE AT [place], [place] [address] [phone]  Date:  09/14/2016   Name:  Renee Anderson   DOB:  06-21-1960   MRN:  [id number]  PCP:  Hayden Rasmussen., MD    History of Present Illness:  Renee Anderson is a 56 y.o. female patient who presents to PCP with  Chief Complaint  Patient presents with  . Ear Pain    left ear hurts more but both hurt   . Facial Pain  . Sinus Problem    Yesterday morning, with pressure building.  She feels vast amount of pressure.  More on the left side.  No fever.  Mucus is minimlaly thick.  She used nedipot this morning which helped.   No sneezing or watery eyes.   She has a hx of seasonal allergies.  No coughing She states that over the last 2-3 weeks she has a general fatigue.  Current stressors with best friend's husband ill.     Patient Active Problem List   Diagnosis Date Noted  . Pernicious anemia   . Basal cell carcinoma   . Seasonal allergies     Past Medical History:  Diagnosis Date  . Allergy   . Basal cell carcinoma   . Pernicious anemia   . Seasonal allergies     Past Surgical History:  Procedure Laterality Date  . CESAREAN SECTION    . CHOLECYSTECTOMY    . MOHS SURGERY      Social History  Substance Use Topics  . Smoking status: Current Every Day Smoker    Packs/day: 1.00    Years: 37.00  . Smokeless tobacco: Never Used  . Alcohol use No    No family history on file.  No Known Allergies  Medication list has been reviewed and updated.  Current Outpatient Prescriptions on File Prior to Visit  Medication Sig Dispense Refill  . cyanocobalamin (,VITAMIN B-12,) 1000 MCG/ML injection Inject 1 mL (1,000 mcg total) into the muscle every 14 (fourteen) days. 10 mL 0  . furosemide (LASIX) 10 MG/ML solution Take by mouth as needed.    Marland Kitchen ipratropium (ATROVENT) 0.03 % nasal spray Place 2 sprays into both nostrils 2 (two) times daily. 30 mL 0  . prenatal vitamin w/FE, FA (PRENATAL 1 + 1) 27-1 MG  TABS TAKE ONE TABLET BY MOUTH EVERY DAY 90 each 3  . azithromycin (ZITHROMAX) 250 MG tablet Take 2 tabs PO x 1 dose, then 1 tab PO QD x 4 days (Patient not taking: Reported on 09/14/2016) 6 tablet 0   No current facility-administered medications on file prior to visit.     ROS ROS otherwise unremarkable unless listed above.  Physical Examination: BP 126/78   Pulse 67   Temp 98.5 F (36.9 C) (Oral)   Resp 18   Ht 5\' 4"  (1.626 m)   Wt 202 lb 3.2 oz (91.7 kg)   SpO2 99%   BMI 34.71 kg/m  Ideal Body Weight: Weight in (lb) to have BMI = 25: 145.3  Physical Exam  Constitutional: She is oriented to person, place, and time. She appears well-developed and well-nourished. No distress.  HENT:  Head: Normocephalic and atraumatic.  Right Ear: Tympanic membrane, external ear and ear canal normal.  Left Ear: Tympanic membrane, external ear and ear canal normal.  Nose: Mucosal edema and rhinorrhea present. Right sinus exhibits no maxillary sinus tenderness and no frontal sinus tenderness. Left sinus exhibits no maxillary sinus tenderness and no frontal sinus tenderness.  Mouth/Throat: No uvula swelling. No oropharyngeal exudate, posterior oropharyngeal edema or posterior oropharyngeal erythema.  Eyes: Conjunctivae and EOM are normal. Pupils are equal, round, and reactive to light.  Cardiovascular: Normal rate and regular rhythm.  Exam reveals no gallop, no distant heart sounds and no friction rub.   No murmur heard. Pulmonary/Chest: Effort normal. No respiratory distress. She has no decreased breath sounds. She has no wheezes. She has no rhonchi.  Lymphadenopathy:       Head (right side): No submandibular, no tonsillar, no preauricular and no posterior auricular adenopathy present.       Head (left side): No submandibular, no tonsillar, no preauricular and no posterior auricular adenopathy present.  Neurological: She is alert and oriented to person, place, and time.  Skin: She is not  diaphoretic.  Psychiatric: She has a normal mood and affect. Her behavior is normal.     Assessment and Plan: Renee Anderson is a 56 y.o. female who is here today for  Chief Complaint  Patient presents with  . Ear Pain    left ear hurts more but both hurt   . Facial Pain  . Sinus Problem  --augmentin 875-125 bid for 7 days if not better within 7 days..   At this time, likely viral sinusitis. She will obtain labs today for this fatigue, possible psychosocial stressors. Acute non-recurrent sinusitis, unspecified location - Plan: Guaifenesin (MUCINEX MAXIMUM STRENGTH) 1200 MG TB12, fluticasone (FLONASE) 50 MCG/ACT nasal spray, cetirizine (ZYRTEC) 10 MG tablet  Other fatigue - Plan: CBC, TSH, Vitamin U98, Basic metabolic panel  Ivar Drape, PA-C [facility] 4/16/20188:34 AM

## 2016-09-15 LAB — CBC
Hematocrit: 41.9 % (ref 34.0–46.6)
Hemoglobin: 13.5 g/dL (ref 11.1–15.9)
MCH: 31.8 pg (ref 26.6–33.0)
MCHC: 32.2 g/dL (ref 31.5–35.7)
MCV: 99 fL — ABNORMAL HIGH (ref 79–97)
Platelets: 267 10*3/uL (ref 150–379)
RBC: 4.24 x10E6/uL (ref 3.77–5.28)
RDW: 13.9 % (ref 12.3–15.4)
WBC: 5.3 10*3/uL (ref 3.4–10.8)

## 2016-09-15 LAB — BASIC METABOLIC PANEL
BUN / CREAT RATIO: 17 (ref 9–23)
BUN: 11 mg/dL (ref 6–24)
CHLORIDE: 105 mmol/L (ref 96–106)
CO2: 24 mmol/L (ref 18–29)
Calcium: 9.3 mg/dL (ref 8.7–10.2)
Creatinine, Ser: 0.63 mg/dL (ref 0.57–1.00)
GFR calc non Af Amer: 102 mL/min/{1.73_m2} (ref >59)
GFR, EST AFRICAN AMERICAN: 118 mL/min/{1.73_m2} (ref >59)
GLUCOSE: 89 mg/dL (ref 65–99)
Potassium: 4.4 mmol/L (ref 3.5–5.2)
Sodium: 144 mmol/L (ref 134–144)

## 2016-09-15 LAB — TSH: TSH: 1 u[IU]/mL (ref 0.450–4.500)

## 2016-09-15 LAB — VITAMIN B12: VITAMIN B 12: 726 pg/mL (ref 232–1245)

## 2016-09-21 ENCOUNTER — Telehealth: Payer: Self-pay | Admitting: General Practice

## 2016-09-21 NOTE — Telephone Encounter (Signed)
Pt is needing to go ahead and get the antibodic that she was told that it would be in her chart to prescribe if she is not any better   Best number [phone]

## 2016-09-22 MED ORDER — AMOXICILLIN-POT CLAVULANATE 875-125 MG PO TABS: 1.0000 | ORAL_TABLET | Freq: Two times a day (BID) | ORAL | 0 refills | Status: DC

## 2016-09-22 NOTE — Telephone Encounter (Signed)
augmentin 875-125 bid for 7 days if not better within 7 days.Marland Kitchen

## 2016-10-14 ENCOUNTER — Encounter: Payer: Self-pay | Admitting: Family Medicine

## 2016-10-14 ENCOUNTER — Ambulatory Visit (INDEPENDENT_AMBULATORY_CARE_PROVIDER_SITE_OTHER): Payer: BLUE CROSS/BLUE SHIELD | Admitting: Family Medicine

## 2016-10-14 VITALS — BP 98/67 | HR 92 | Temp 98.1°F | Resp 17 | Ht 64.0 in | Wt 199.5 lb

## 2016-10-14 DIAGNOSIS — F17200 Nicotine dependence, unspecified, uncomplicated: Secondary | ICD-10-CM

## 2016-10-14 DIAGNOSIS — J0101 Acute recurrent maxillary sinusitis: Secondary | ICD-10-CM

## 2016-10-14 DIAGNOSIS — J019 Acute sinusitis, unspecified: Secondary | ICD-10-CM | POA: Diagnosis not present

## 2016-10-14 MED ORDER — ALBUTEROL SULFATE (2.5 MG/3ML) 0.083% IN NEBU
2.5000 mg | INHALATION_SOLUTION | Freq: Once | RESPIRATORY_TRACT | Status: AC
  Administered 2016-10-14: 2.5 mg via RESPIRATORY_TRACT

## 2016-10-14 MED ORDER — FLUCONAZOLE 150 MG PO TABS: 150.0000 mg | ORAL_TABLET | Freq: Once | ORAL | 0 refills | Status: AC

## 2016-10-14 MED ORDER — FLUTICASONE PROPIONATE 50 MCG/ACT NA SUSP: 2.0000 | Freq: Every day | NASAL | 12 refills | Status: DC

## 2016-10-14 MED ORDER — ALBUTEROL SULFATE HFA 108 (90 BASE) MCG/ACT IN AERS: 2.0000 | INHALATION_SPRAY | RESPIRATORY_TRACT | 1 refills | Status: DC | PRN

## 2016-10-14 MED ORDER — IPRATROPIUM BROMIDE 0.02 % IN SOLN
0.5000 mg | Freq: Once | RESPIRATORY_TRACT | Status: AC
  Administered 2016-10-14: 0.5 mg via RESPIRATORY_TRACT

## 2016-10-14 MED ORDER — LEVOFLOXACIN 750 MG PO TABS: 750.0000 mg | ORAL_TABLET | Freq: Every day | ORAL | 0 refills | Status: DC

## 2016-10-14 MED ORDER — CETIRIZINE HCL 10 MG PO TABS: 10.0000 mg | ORAL_TABLET | Freq: Every day | ORAL | 1 refills | Status: AC

## 2016-10-14 MED ORDER — PREDNISONE 20 MG PO TABS: ORAL_TABLET | ORAL | 0 refills | Status: DC

## 2016-10-14 NOTE — Progress Notes (Signed)
Subjective:  By signing my name below, I, Essence Howell, attest that this documentation has been prepared under the direction and in the presence of Delman Cheadle, MD Electronically Signed: Ladene Artist, ED Scribe 10/14/2016 at 2:48 PM.   Patient ID: Renee Anderson, female    DOB: Apr 09, 1961, 56 y.o.   MRN: [id number]  Chief Complaint  Patient presents with  . URI    headache, sinus pressure, head congestion since Thursday   HPI Renee Anderson is a 57 y.o. female who presents to Primary Care at [facility] complaining of gradually worsening nasal congestion onset 2 days ago. Pt reports associated symptoms of fever with Tmax of 100 F yesterday, sneezing, sinus pressure, right ear itching, right ear pain that radiates into her right teeth and the right side of her head, post-nasal drip, cough with yellow sputum, wheezing, sob. She has tried OTC allergy pills, Mucinex and ibuprofen without significant relief. Pt reports a recent sinus infection last month; states symptoms cleared up for about 2 weeks before onset of current symptoms. She denies h/o asthma or inhaler use. Pt currently smokes 0.5 ppd.   Past Medical History:  Diagnosis Date  . Allergy   . Basal cell carcinoma   . Pernicious anemia   . Seasonal allergies    Current Outpatient Prescriptions on File Prior to Visit  Medication Sig Dispense Refill  . cyanocobalamin (,VITAMIN B-12,) 1000 MCG/ML injection Inject 1 mL (1,000 mcg total) into the muscle every 14 (fourteen) days. 10 mL 0  . fluticasone (FLONASE) 50 MCG/ACT nasal spray Place 2 sprays into both nostrils daily. 16 g 12  . furosemide (LASIX) 10 MG/ML solution Take by mouth as needed.    . Guaifenesin (MUCINEX MAXIMUM STRENGTH) 1200 MG TB12 Take 1 tablet (1,200 mg total) by mouth every 12 (twelve) hours as needed. 14 tablet 1  . ipratropium (ATROVENT) 0.03 % nasal spray Place 2 sprays into both nostrils 2 (two) times daily. 30 mL 0  . prenatal vitamin w/FE, FA (PRENATAL 1 + 1)  27-1 MG TABS TAKE ONE TABLET BY MOUTH EVERY DAY 90 each 3  . amoxicillin-clavulanate (AUGMENTIN) 875-125 MG tablet Take 1 tablet by mouth 2 (two) times daily. (Patient not taking: Reported on 10/14/2016) 14 tablet 0  . azithromycin (ZITHROMAX) 250 MG tablet Take 2 tabs PO x 1 dose, then 1 tab PO QD x 4 days (Patient not taking: Reported on 09/14/2016) 6 tablet 0  . cetirizine (ZYRTEC) 10 MG tablet Take 1 tablet (10 mg total) by mouth daily. (Patient not taking: Reported on 10/14/2016) 14 tablet 1   No current facility-administered medications on file prior to visit.    No Known Allergies   Review of Systems  Constitutional: Positive for chills and fever.  HENT: Positive for congestion, ear pain, postnasal drip, sinus pressure and sneezing.   Respiratory: Positive for cough, shortness of breath and wheezing.   Neurological: Positive for headaches.      Objective:   Physical Exam  Constitutional: She is oriented to person, place, and time. She appears well-developed and well-nourished. No distress.  HENT:  Head: Normocephalic and atraumatic.  Right Ear: Tympanic membrane is erythematous and retracted.  Left Ear: Tympanic membrane is erythematous and retracted.  Nose: Rhinorrhea (purulent) present.  Mouth/Throat: Oropharynx is clear and moist and mucous membranes are normal.  Eyes: Conjunctivae and EOM are normal.  Neck: Neck supple. No tracheal deviation present. No thyromegaly present.  Cardiovascular: Normal rate, regular rhythm, S1 normal, S2 normal  and normal heart sounds.   Pulmonary/Chest: Effort normal. No respiratory distress. She has rhonchi (L inspiratory).  Coarse breath sounds bibasilar.  Musculoskeletal: Normal range of motion.  Lymphadenopathy:    She has no cervical adenopathy.  Neurological: She is alert and oriented to person, place, and time.  Skin: Skin is warm and dry.  Psychiatric: She has a normal mood and affect. Her behavior is normal.  Nursing note and vitals  reviewed.  BP 98/67   Pulse 92   Temp 98.1 F (36.7 C) (Oral)   Resp 17   Ht 5\' 4"  (1.626 m)   Wt 199 lb 8 oz (90.5 kg)   SpO2 96%   PF 350 L/min Comment: Post Nebulizer Treatment with Duoneb  BMI 34.24 kg/m  PF 340 before Treatment    Assessment & Plan:   1. Acute recurrent maxillary sinusitis - recurrent illness after augmentin 1 mo prior and s/p zpack sev mos ago so will step up treatment to levaquin and pred taper. Start zyrtec and flonase x 1 mo to help reduce risk of recurrence.  2. Tobacco use disorder - no improvement in peak flow with neb treatment but does have improvement in sxs and exam so try prn albuterol inh. Pt planning to stop smoking, encouraged to RTC to discuss interventions if wants any help.  3. Acute non-recurrent sinusitis, unspecified location     Orders Placed This Encounter  Procedures  . Care order/instruction:    Scheduling Instructions:     [facility] (IF NEB IS ORDERED PLEASE DO BEFORE AND AFTER NEB)    Meds ordered this encounter  Medications  . albuterol (PROVENTIL) (2.5 MG/3ML) 0.083% nebulizer solution 2.5 mg  . ipratropium (ATROVENT) nebulizer solution 0.5 mg  . predniSONE (DELTASONE) 20 MG tablet    Sig: Take 3 tabs qd x 3d, then 2 tabs qd x 3d then 1 tab qd x 3d.    Dispense:  18 tablet    Refill:  0  . cetirizine (ZYRTEC) 10 MG tablet    Sig: Take 1 tablet (10 mg total) by mouth daily.    Dispense:  14 tablet    Refill:  1  . DISCONTD: fluticasone (FLONASE) 50 MCG/ACT nasal spray    Sig: Place 2 sprays into both nostrils daily.    Dispense:  16 g    Refill:  12  . levofloxacin (LEVAQUIN) 750 MG tablet    Sig: Take 1 tablet (750 mg total) by mouth daily.    Dispense:  5 tablet    Refill:  0  . fluticasone (FLONASE) 50 MCG/ACT nasal spray    Sig: Place 2 sprays into both nostrils daily.    Dispense:  16 g    Refill:  12  . albuterol (PROVENTIL HFA;VENTOLIN HFA) 108 (90 Base) MCG/ACT inhaler    Sig: Inhale 2 puffs into the lungs  every 4 (four) hours as needed for wheezing or shortness of breath (cough, shortness of breath or wheezing.).    Dispense:  1 Inhaler    Refill:  1    Whichever is preferred by insurance  . fluconazole (DIFLUCAN) 150 MG tablet    Sig: Take 1 tablet (150 mg total) by mouth once. If needed after antibiotic course is complete    Dispense:  1 tablet    Refill:  0    I personally performed the services described in this documentation, which was scribed in my presence. The recorded information has been reviewed and considered, and addended  by me as needed.   Delman Cheadle, M.D.  Primary Care at [facility] [address] [place], [place] [address] [phone] phone [phone] fax  10/16/16 9:07 PM

## 2016-10-14 NOTE — Patient Instructions (Addendum)
IF you received an x-ray today, you will receive an invoice from [facility]. Please contact [facility] at [phone] with questions or concerns regarding your invoice.   IF you received labwork today, you will receive an invoice from [facility]. Please contact [facility] at [phone] with questions or concerns regarding your invoice.   Our billing staff will not be able to assist you with questions regarding bills from these companies.  You will be contacted with the lab results as soon as they are available. The fastest way to get your results is to activate your My Chart account. Instructions are located on the last page of this paperwork. If you have not heard from [place] regarding the results in 2 weeks, please contact this office.    Chronic Obstructive Pulmonary Disease Chronic obstructive pulmonary disease (COPD) is a common lung condition in which airflow from the lungs is limited. COPD is a general term that can be used to describe many different lung problems that limit airflow, including both chronic bronchitis and emphysema. If you have COPD, your lung function will probably never return to normal, but there are measures you can take to improve lung function and make yourself feel better. What are the causes?  Smoking (common).  Exposure to secondhand smoke.  Genetic problems.  Chronic inflammatory lung diseases or recurrent infections. What are the signs or symptoms?  Shortness of breath, especially with physical activity.  Deep, persistent (chronic) cough with a large amount of thick mucus.  Wheezing.  Rapid breaths (tachypnea).  Gray or bluish discoloration (cyanosis) of the skin, especially in your fingers, toes, or lips.  Fatigue.  Weight loss.  Frequent infections or episodes when breathing symptoms become much worse (exacerbations).  Chest tightness. How is this diagnosed? Your health care provider will take a medical history and  perform a physical examination to diagnose COPD. Additional tests for COPD may include:  Lung (pulmonary) function tests.  Chest X-ray.  CT scan.  Blood tests. How is this treated? Treatment for COPD may include:  Inhaler and nebulizer medicines. These help manage the symptoms of COPD and make your breathing more comfortable.  Supplemental oxygen. Supplemental oxygen is only helpful if you have a low oxygen level in your blood.  Exercise and physical activity. These are beneficial for nearly all people with COPD.  Lung surgery or transplant.  Nutrition therapy to gain weight, if you are underweight.  Pulmonary rehabilitation. This may involve working with a team of health care providers and specialists, such as respiratory, occupational, and physical therapists. Follow these instructions at home:  Take all medicines (inhaled or pills) as directed by your health care provider.  Avoid over-the-counter medicines or cough syrups that dry up your airway (such as antihistamines) and slow down the elimination of secretions unless instructed otherwise by your health care provider.  If you are a smoker, the most important thing that you can do is stop smoking. Continuing to smoke will cause further lung damage and breathing trouble. Ask your health care provider for help with quitting smoking. He or she can direct you to community resources or hospitals that provide support.  Avoid exposure to irritants such as smoke, chemicals, and fumes that aggravate your breathing.  Use oxygen therapy and pulmonary rehabilitation if directed by your health care provider. If you require home oxygen therapy, ask your health care provider whether you should purchase a pulse oximeter to measure your oxygen level at home.  Avoid contact with individuals who  have a contagious illness.  Avoid extreme temperature and humidity changes.  Eat healthy foods. Eating smaller, more frequent meals and resting  before meals may help you maintain your strength.  Stay active, but balance activity with periods of rest. Exercise and physical activity will help you maintain your ability to do things you want to do.  Preventing infection and hospitalization is very important when you have COPD. Make sure to receive all the vaccines your health care provider recommends, especially the pneumococcal and influenza vaccines. Ask your health care provider whether you need a pneumonia vaccine.  Learn and use relaxation techniques to manage stress.  Learn and use controlled breathing techniques as directed by your health care provider. Controlled breathing techniques include: 1. Pursed lip breathing. Start by breathing in (inhaling) through your nose for 1 second. Then, purse your lips as if you were going to whistle and breathe out (exhale) through the pursed lips for 2 seconds. 2. Diaphragmatic breathing. Start by putting one hand on your abdomen just above your waist. Inhale slowly through your nose. The hand on your abdomen should move out. Then purse your lips and exhale slowly. You should be able to feel the hand on your abdomen moving in as you exhale.  Learn and use controlled coughing to clear mucus from your lungs. Controlled coughing is a series of short, progressive coughs. The steps of controlled coughing are: 1. Lean your head slightly forward. 2. Breathe in deeply using diaphragmatic breathing. 3. Try to hold your breath for 3 seconds. 4. Keep your mouth slightly open while coughing twice. 5. Spit any mucus out into a tissue. 6. Rest and repeat the steps once or twice as needed. Contact a health care provider if:  You are coughing up more mucus than usual.  There is a change in the color or thickness of your mucus.  Your breathing is more labored than usual.  Your breathing is faster than usual. Get help right away if:  You have shortness of breath while you are resting.  You have shortness  of breath that prevents you from:  Being able to talk.  Performing your usual physical activities.  You have chest pain lasting longer than 5 minutes.  Your skin color is more cyanotic than usual.  You measure low oxygen saturations for longer than 5 minutes with a pulse oximeter. This information is not intended to replace advice given to you by your health care provider. Make sure you discuss any questions you have with your health care provider. Document Released: 03/01/2005 Document Revised: 10/28/2015 Document Reviewed: 01/16/2013 Elsevier Interactive Patient Education  2017 [employer].   Steps to Quit Smoking Smoking tobacco can be harmful to your health and can affect almost every organ in your body. Smoking puts you, and those around you, at risk for developing many serious chronic diseases. Quitting smoking is difficult, but it is one of the best things that you can do for your health. It is never too late to quit. What are the benefits of quitting smoking? When you quit smoking, you lower your risk of developing serious diseases and conditions, such as:  Lung cancer or lung disease, such as COPD.  Heart disease.  Stroke.  Heart attack.  Infertility.  Osteoporosis and bone fractures. Additionally, symptoms such as coughing, wheezing, and shortness of breath may get better when you quit. You may also find that you get sick less often because your body is stronger at fighting off colds and infections. If  you are pregnant, quitting smoking can help to reduce your chances of having a baby of low birth weight. How do I get ready to quit? When you decide to quit smoking, create a plan to make sure that you are successful. Before you quit:  Pick a date to quit. Set a date within the next two weeks to give you time to prepare.  Write down the reasons why you are quitting. Keep this list in places where you will see it often, such as on your bathroom mirror or in your car or  wallet.  Identify the people, places, things, and activities that make you want to smoke (triggers) and avoid them. Make sure to take these actions:  Throw away all cigarettes at home, at work, and in your car.  Throw away smoking accessories, such as Scientist, research (medical).  Clean your car and make sure to empty the ashtray.  Clean your home, including curtains and carpets.  Tell your family, friends, and coworkers that you are quitting. Support from your loved ones can make quitting easier.  Talk with your health care provider about your options for quitting smoking.  Find out what treatment options are covered by your health insurance. What strategies can I use to quit smoking? Talk with your healthcare provider about different strategies to quit smoking. Some strategies include:  Quitting smoking altogether instead of gradually lessening how much you smoke over a period of time. Research shows that quitting "cold [place]" is more successful than gradually quitting.  Attending in-person counseling to help you build problem-solving skills. You are more likely to have success in quitting if you attend several counseling sessions. Even short sessions of 10 minutes can be effective.  Finding resources and support systems that can help you to quit smoking and remain smoke-free after you quit. These resources are most helpful when you use them often. They can include:  Online chats with a Social worker.  Telephone quitlines.  Printed Furniture conservator/restorer.  Support groups or group counseling.  Text messaging programs.  Mobile phone applications.  Taking medicines to help you quit smoking. (If you are pregnant or breastfeeding, talk with your health care provider first.) Some medicines contain nicotine and some do not. Both types of medicines help with cravings, but the medicines that include nicotine help to relieve withdrawal symptoms. Your health care provider may recommend:  Nicotine  patches, gum, or lozenges.  Nicotine inhalers or sprays.  Non-nicotine medicine that is taken by mouth. Talk with your health care provider about combining strategies, such as taking medicines while you are also receiving in-person counseling. Using these two strategies together makes you more likely to succeed in quitting than if you used either strategy on its own. If you are pregnant or breastfeeding, talk with your health care provider about finding counseling or other support strategies to quit smoking. Do not take medicine to help you quit smoking unless told to do so by your health care provider. What things can I do to make it easier to quit? Quitting smoking might feel overwhelming at first, but there is a lot that you can do to make it easier. Take these important actions:  Reach out to your family and friends and ask that they support and encourage you during this time. Call telephone quitlines, reach out to support groups, or work with a counselor for support.  Ask people who smoke to avoid smoking around you.  Avoid places that trigger you to smoke, such as bars, parties,  or smoke-break areas at work.  Spend time around people who do not smoke.  Lessen stress in your life, because stress can be a smoking trigger for some people. To lessen stress, try:  Exercising regularly.  Deep-breathing exercises.  Yoga.  Meditating.  Performing a body scan. This involves closing your eyes, scanning your body from head to toe, and noticing which parts of your body are particularly tense. Purposefully relax the muscles in those areas.  Download or purchase mobile phone or tablet apps (applications) that can help you stick to your quit plan by providing reminders, tips, and encouragement. There are many free apps, such as QuitGuide from the [employer] Office manager for Disease Control and Prevention). You can find other support for quitting smoking (smoking cessation) through smokefree.gov and other  websites. How will I feel when I quit smoking? Within the first 24 hours of quitting smoking, you may start to feel some withdrawal symptoms. These symptoms are usually most noticeable 2-3 days after quitting, but they usually do not last beyond 2-3 weeks. Changes or symptoms that you might experience include:  Mood swings.  Restlessness, anxiety, or irritation.  Difficulty concentrating.  Dizziness.  Strong cravings for sugary foods in addition to nicotine.  Mild weight gain.  Constipation.  Nausea.  Coughing or a sore throat.  Changes in how your medicines work in your body.  A depressed mood.  Difficulty sleeping (insomnia). After the first 2-3 weeks of quitting, you may start to notice more positive results, such as:  Improved sense of smell and taste.  Decreased coughing and sore throat.  Slower heart rate.  Lower blood pressure.  Clearer skin.  The ability to breathe more easily.  Fewer sick days. Quitting smoking is very challenging for most people. Do not get discouraged if you are not successful the first time. Some people need to make many attempts to quit before they achieve long-term success. Do your best to stick to your quit plan, and talk with your health care provider if you have any questions or concerns. This information is not intended to replace advice given to you by your health care provider. Make sure you discuss any questions you have with your health care provider. Document Released: 05/16/2001 Document Revised: 01/18/2016 Document Reviewed: 10/06/2014 Elsevier Interactive Patient Education  2017 [employer].

## 2016-11-09 ENCOUNTER — Ambulatory Visit (INDEPENDENT_AMBULATORY_CARE_PROVIDER_SITE_OTHER): Payer: BLUE CROSS/BLUE SHIELD | Admitting: Family

## 2016-11-09 ENCOUNTER — Encounter (INDEPENDENT_AMBULATORY_CARE_PROVIDER_SITE_OTHER): Payer: Self-pay | Admitting: Family

## 2016-11-09 VITALS — Ht 64.0 in | Wt 199.0 lb

## 2016-11-09 DIAGNOSIS — S93491A Sprain of other ligament of right ankle, initial encounter: Secondary | ICD-10-CM

## 2016-11-09 NOTE — Progress Notes (Signed)
   Office Visit Note   Patient: Renee Anderson           Date of Birth: 1961/01/26           MRN: [id number] Visit Date: 11/09/2016              Requested by: Hayden Rasmussen, MD [place], [place] [id number] PCP: Hayden Rasmussen, MD  Chief Complaint  Patient presents with  . Right Ankle - Pain      HPI: The patient is a 56 year old woman who presents today complaining of right ankle pain and swelling this is been ongoing for about 2 weeks. She cannot recall injuring her ankle however was working in the yard less today that this began hurting her. Is able to weight-bear with minimal pain complaints of pain with him plantar flexion and range motion of the ankle.  Assessment & Plan: Visit Diagnoses:  1. Sprain of anterior talofibular ligament of right ankle, initial encounter     Plan: Offered an ASO. She declined. Will use ice and ibu as needed for pain and swelling. Follow up in office as needed.   Follow-Up Instructions: Return if symptoms worsen or fail to improve.   Right Ankle Exam  Swelling: mild  Tenderness  The patient is experiencing tenderness in the ATF.    Range of Motion  The patient has normal right ankle ROM.  Tests  Anterior drawer: negative Other  Erythema: absent Pulse: present       Patient is alert, oriented, no adenopathy, well-dressed, normal affect, normal respiratory effort.   Imaging: No results found.  Labs: Lab Results  Component Value Date   LABORGA NO GROWTH 10/10/2011    Orders:  No orders of the defined types were placed in this encounter.  No orders of the defined types were placed in this encounter.    Procedures: No procedures performed  Clinical Data: No additional findings.  ROS:  All other systems negative, except as noted in the HPI. Review of Systems  Constitutional: Negative for chills and fever.  Musculoskeletal: Positive for arthralgias and joint swelling.    Objective: Vital  Signs: Ht 5\' 4"  (1.626 m)   Wt 199 lb (90.3 kg)   BMI 34.16 kg/m   Specialty Comments:  No specialty comments available.  PMFS History: Patient Active Problem List   Diagnosis Date Noted  . Pernicious anemia   . Basal cell carcinoma   . Seasonal allergies    Past Medical History:  Diagnosis Date  . Allergy   . Basal cell carcinoma   . Pernicious anemia   . Seasonal allergies     No family history on file.  Past Surgical History:  Procedure Laterality Date  . CESAREAN SECTION    . CHOLECYSTECTOMY    . MOHS SURGERY     Social History   Occupational History  . Not on file.   Social History Main Topics  . Smoking status: Current Every Day Smoker    Packs/day: 1.00    Years: 37.00  . Smokeless tobacco: Never Used  . Alcohol use No  . Drug use: Unknown  . Sexual activity: Not on file

## 2017-01-11 ENCOUNTER — Telehealth: Payer: Self-pay | Admitting: *Deleted

## 2017-01-11 NOTE — Telephone Encounter (Signed)
LM for Ms Mckethan with New patient appointment date and time as noted below by [facility].

## 2017-01-11 NOTE — Telephone Encounter (Signed)
Contacted Stacey at [facility] and left message with new patient on August 15th at [facility].

## 2017-01-17 ENCOUNTER — Ambulatory Visit: Payer: BLUE CROSS/BLUE SHIELD | Attending: Gynecologic Oncology | Admitting: Gynecologic Oncology

## 2017-01-17 ENCOUNTER — Encounter: Payer: Self-pay | Admitting: Gynecologic Oncology

## 2017-01-17 VITALS — BP 125/80 | HR 72 | Temp 97.6°F | Resp 18 | Ht 64.0 in | Wt 202.3 lb

## 2017-01-17 DIAGNOSIS — Z9049 Acquired absence of other specified parts of digestive tract: Secondary | ICD-10-CM | POA: Diagnosis not present

## 2017-01-17 DIAGNOSIS — Z85828 Personal history of other malignant neoplasm of skin: Secondary | ICD-10-CM | POA: Diagnosis not present

## 2017-01-17 DIAGNOSIS — Z801 Family history of malignant neoplasm of trachea, bronchus and lung: Secondary | ICD-10-CM | POA: Diagnosis not present

## 2017-01-17 DIAGNOSIS — Z79899 Other long term (current) drug therapy: Secondary | ICD-10-CM | POA: Insufficient documentation

## 2017-01-17 DIAGNOSIS — C541 Malignant neoplasm of endometrium: Secondary | ICD-10-CM | POA: Diagnosis not present

## 2017-01-17 DIAGNOSIS — C801 Malignant (primary) neoplasm, unspecified: Secondary | ICD-10-CM

## 2017-01-17 DIAGNOSIS — F17210 Nicotine dependence, cigarettes, uncomplicated: Secondary | ICD-10-CM | POA: Diagnosis not present

## 2017-01-17 NOTE — Patient Instructions (Addendum)
Preparing for your Surgery  Plan for surgery on:          with Dr. Denman George at [facility] will be scheduled for a robotic assisted total hysterectomy, bilateral salpingo-oophorectomy, sentinel lymph node biopsy.  Pre-operative Testing -You will receive a phone call from presurgical testing at [facility] to arrange for a pre-operative testing appointment before your surgery.  This appointment normally occurs one to two weeks before your scheduled surgery.   -Bring your insurance card, copy of an advanced directive if applicable, medication list  -At that visit, you will be asked to sign a consent for a possible blood transfusion in case a transfusion becomes necessary during surgery.  The need for a blood transfusion is rare but having consent is a necessary part of your care.     -You should not be taking blood thinners or aspirin at least ten days prior to surgery unless instructed by your surgeon.  Day Before Surgery at [place] will be asked to take in a light diet the day before surgery.  Avoid carbonated beverages.  You will be advised to have nothing to eat or drink after midnight the evening before.     Eat a light diet the day before surgery.  Examples including soups, broths, toast, yogurt, mashed potatoes.  Things to avoid include carbonated beverages (fizzy beverages), raw fruits and raw vegetables, or beans.    If your bowels are filled with gas, your surgeon will have difficulty visualizing your pelvic organs which increases your surgical risks.  Your role in recovery Your role is to become active as soon as directed by your doctor, while still giving yourself time to heal.  Rest when you feel tired. You will be asked to do the following in order to speed your recovery:  - Cough and breathe deeply. This helps toclear and expand your lungs and can prevent pneumonia. You may be given a spirometer to practice deep breathing. A staff member will  show you how to use the spirometer. - Do mild physical activity. Walking or moving your legs help your circulation and body functions return to normal. A staff member will help you when you try to walk and will provide you with simple exercises. Do not try to get up or walk alone the first time. - Actively manage your pain. Managing your pain lets you move in comfort. We will ask you to rate your pain on a scale of zero to 10. It is your responsibility to tell your doctor or nurse where and how much you hurt so your pain can be treated.  Special Considerations -If you are diabetic, you may be placed on insulin after surgery to have closer control over your blood sugars to promote healing and recovery.  This does not mean that you will be discharged on insulin.  If applicable, your oral antidiabetics will be resumed when you are tolerating a solid diet.  -Your final pathology results from surgery should be available by the Friday after surgery and the results will be relayed to you when available.   Blood Transfusion Information WHAT IS A BLOOD TRANSFUSION? A transfusion is the replacement of blood or some of its parts. Blood is made up of multiple cells which provide different functions.  Red blood cells carry oxygen and are used for blood loss replacement.  White blood cells fight against infection.  Platelets control bleeding.  Plasma helps clot blood.  Other blood products are available for  specialized needs, such as hemophilia or other clotting disorders. BEFORE THE TRANSFUSION  Who gives blood for transfusions?   You may be able to donate blood to be used at a later date on yourself (autologous donation).  Relatives can be asked to donate blood. This is generally not any safer than if you have received blood from a stranger. The same precautions are taken to ensure safety when a relative's blood is donated.  Healthy volunteers who are fully evaluated to make sure their blood is  safe. This is blood bank blood. Transfusion therapy is the safest it has ever been in the practice of medicine. Before blood is taken from a donor, a complete history is taken to make sure that person has no history of diseases nor engages in risky social behavior (examples are intravenous drug use or sexual activity with multiple partners). The donor's travel history is screened to minimize risk of transmitting infections, such as malaria. The donated blood is tested for signs of infectious diseases, such as HIV and hepatitis. The blood is then tested to be sure it is compatible with you in order to minimize the chance of a transfusion reaction. If you or a relative donates blood, this is often done in anticipation of surgery and is not appropriate for emergency situations. It takes many days to process the donated blood. RISKS AND COMPLICATIONS Although transfusion therapy is very safe and saves many lives, the main dangers of transfusion include:   Getting an infectious disease.  Developing a transfusion reaction. This is an allergic reaction to something in the blood you were given. Every precaution is taken to prevent this. The decision to have a blood transfusion has been considered carefully by your caregiver before blood is given. Blood is not given unless the benefits outweigh the risks.

## 2017-01-17 NOTE — Progress Notes (Signed)
Consult Note: Gyn-Onc  Consult was requested by Dr. Ross for the evaluation of Renee Anderson 56 y.o. female  CC:  Chief Complaint  Patient presents with  . Adenocarcinoma    FIGO I    Assessment/Plan:  Renee Anderson  is a 56 y.o.  year old with grade 1 endometrial cancer. A detailed discussion was held with the patient and her family with regard to to her endometrial cancer diagnosis. We discussed the standard management options for uterine cancer which includes surgery followed possibly by adjuvant therapy depending on the results of surgery. The options for surgical management include a hysterectomy and removal of the tubes and ovaries possibly with removal of pelvic and para-aortic lymph nodes.If feasible, a minimally invasive approach including a robotic hysterectomy or laparoscopic hysterectomy have benefits including shorter hospital stay, recovery time and better wound healing than with open surgery. The patient has been counseled about these surgical options and the risks of surgery in general including infection, bleeding, damage to surrounding structures (including bowel, bladder, ureters, nerves or vessels), and the postoperative risks of PE/ DVT, and lymphedema. I extensively reviewed the additional risks of robotic hysterectomy including possible need for conversion to open laparotomy.  I discussed positioning during surgery of trendelenberg and risks of minor facial swelling and care we take in preoperative positioning.  After counseling and consideration of her options, she desires to proceed with robotic assisted total hysterectomy with bilateral sapingo-oophorectomy and SLN biopsy.   She will be seen by anesthesia for preoperative clearance and discussion of postoperative pain management.  She was given the opportunity to ask questions, which were answered to her satisfaction, and she is agreement with the above mentioned plan of care.   HPI: Renee Anderson is a 56 year old  G2P2 who is seen in consultation at the request for Dr Kendra Ross for grade 1 endometrial cancer. The patient has a history of abnormal uterine bleeding (remote) that has been well controlled with a Mirena IUD for many years (most recent one placed February, 2014). She began developing new vaginal spotting in May 2018 and was seen by Dr Ross who performed an US on 12/25/16. This revealed a uterus measuring 5.4x4x4cm and a thickened stripe of 4cm. The adnexa were normal.  An office endometrial biopsy was performed which revealed grade 1 endometrioid endometrial cancer.  The patient's only family history is a paternal aunt with lung cancer (smoker). Her only prior surgeries were c/s x 2 and open cholecystectomy. She is otherwise very healthy.   Current Meds:  Outpatient Encounter Prescriptions as of 01/17/2017  Medication Sig  . cetirizine (ZYRTEC) 10 MG tablet Take 1 tablet (10 mg total) by mouth daily.  . cyanocobalamin (,VITAMIN B-12,) 1000 MCG/ML injection Inject 1 mL (1,000 mcg total) into the muscle every 14 (fourteen) days.  . fluticasone (FLONASE) 50 MCG/ACT nasal spray Place 2 sprays into both nostrils daily.  . furosemide (LASIX) 10 MG/ML solution Take by mouth as needed.  . Guaifenesin (MUCINEX MAXIMUM STRENGTH) 1200 MG TB12 Take 1 tablet (1,200 mg total) by mouth every 12 (twelve) hours as needed.  . ibuprofen (ADVIL,MOTRIN) 200 MG tablet Take 200 mg by mouth every 6 (six) hours as needed for mild pain.  . ipratropium (ATROVENT) 0.03 % nasal spray Place 2 sprays into both nostrils 2 (two) times daily.  . prenatal vitamin w/FE, FA (PRENATAL 1 + 1) 27-1 MG TABS TAKE ONE TABLET BY MOUTH EVERY DAY  . [DISCONTINUED] albuterol (PROVENTIL HFA;VENTOLIN   HFA) 108 (90 Base) MCG/ACT inhaler Inhale 2 puffs into the lungs every 4 (four) hours as needed for wheezing or shortness of breath (cough, shortness of breath or wheezing.). (Patient not taking: Reported on 01/17/2017)  . [DISCONTINUED]  levofloxacin (LEVAQUIN) 750 MG tablet Take 1 tablet (750 mg total) by mouth daily. (Patient not taking: Reported on 01/17/2017)  . [DISCONTINUED] predniSONE (DELTASONE) 20 MG tablet Take 3 tabs qd x 3d, then 2 tabs qd x 3d then 1 tab qd x 3d. (Patient not taking: Reported on 01/17/2017)   No facility-administered encounter medications on file as of 01/17/2017.     Allergy: No Known Allergies  Social Hx:   Social History   Social History  . Marital status: Single    Spouse name: N/A  . Number of children: N/A  . Years of education: N/A   Occupational History  . Not on file.   Social History Main Topics  . Smoking status: Current Every Day Smoker    Packs/day: 0.50    Years: 37.00  . Smokeless tobacco: Never Used  . Alcohol use No  . Drug use: No  . Sexual activity: Not on file   Other Topics Concern  . Not on file   Social History Narrative  . No narrative on file    Past Surgical Hx:  Past Surgical History:  Procedure Laterality Date  . CESAREAN SECTION     x2 1996, 1986  . CHOLECYSTECTOMY    . MOHS SURGERY      Past Medical Hx:  Past Medical History:  Diagnosis Date  . Allergy   . Basal cell carcinoma   . Pernicious anemia   . Seasonal allergies     Past Gynecological History:  Abnormal uterine bleeding, c/s x 2. No LMP recorded. Patient is not currently having periods (Reason: IUD).  Family Hx:  Family History  Problem Relation Age of Onset  . Cancer Paternal Aunt     Review of Systems:  Constitutional  Feels well,    ENT Normal appearing ears and nares bilaterally Skin/Breast  No rash, sores, jaundice, itching, dryness Cardiovascular  No chest pain, shortness of breath, or edema  Pulmonary  No cough or wheeze.  Gastro Intestinal  No nausea, vomitting, or diarrhoea. No bright red blood per rectum, no abdominal pain, change in bowel movement, or constipation.  Genito Urinary  No frequency, urgency, dysuria, + abnormal bleeding. Musculo  Skeletal  No myalgia, arthralgia, joint swelling or pain  Neurologic  No weakness, numbness, change in gait,  Psychology  No depression, anxiety, insomnia.   Vitals:  Blood pressure 125/80, pulse 72, temperature 97.6 F (36.4 C), temperature source Oral, resp. rate 18, height 5' 4" (1.626 m), weight 202 lb 4.8 oz (91.8 kg), SpO2 98 %.  Physical Exam: WD in NAD Neck  Supple NROM, without any enlargements.  Lymph Node Survey No cervical supraclavicular or inguinal adenopathy Cardiovascular  Pulse normal rate, regularity and rhythm. S1 and S2 normal.  Lungs  Clear to auscultation bilateraly, without wheezes/crackles/rhonchi. Good air movement.  Skin  No rash/lesions/breakdown  Psychiatry  Alert and oriented to person, place, and time  Abdomen  Normoactive bowel sounds, abdomen soft, non-tender and overweight without evidence of hernia.  Back No CVA tenderness Genito Urinary  Vulva/vagina: Normal external female genitalia.  No lesions. No discharge or bleeding.  Bladder/urethra:  No lesions or masses, well supported bladder  Vagina: normal  Cervix: Normal appearing, no lesions.  Uterus:  Small, mobile, no parametrial   involvement or nodularity.  Adnexa: no palpable masses. Rectal  deferred Extremities  No bilateral cyanosis, clubbing or edema.   Keishawn Rajewski Caroline, MD  01/17/2017, 9:51 AM     

## 2017-01-18 NOTE — Patient Instructions (Addendum)
Renee Anderson  01/18/2017   Your procedure is scheduled on: 01-25-17  Report to [facility] to 3rd floor to  [place] at 12:30 PM.   Call this number if you have problems the morning of surgery [phone]   Remember: ONLY 1 PERSON MAY GO WITH YOU TO SHORT STAY TO GET  READY MORNING OF [place].    Eat a light diet the day before surgery.  Examples including soups, broths, toast, yogurt, mashed potatoes.  Things to avoid include carbonated beverages (fizzy beverages), raw fruits and raw vegetables, or beans.   If your bowels are filled with gas, your surgeon will have difficulty visualizing your pelvic organs which increases your surgical risks.  Do not eat food or drink liquids :After Midnight.You may have a Clear Liquid Diet from Midnight until 08:30 AM. After 8:30 AM, nothing by mouth.    CLEAR LIQUID DIET   Foods Allowed                                                                     Foods Excluded  Coffee and tea, regular and decaf                             liquids that you cannot  Plain Jell-O in any flavor                                             see through such as: Fruit ices (not with fruit pulp)                                     milk, soups, orange juice  Iced Popsicles                                    All solid food Carbonated beverages, regular and diet                                    Cranberry, grape and apple juices Sports drinks like Gatorade Lightly seasoned clear broth or consume(fat free) Sugar, honey syrup  Sample Menu Breakfast                                Lunch                                     Supper Cranberry juice                    Beef broth  Chicken broth Jell-O                                     Grape juice                           Apple juice Coffee or tea                        Jell-O                                       Popsicle                                                Coffee or tea                        Coffee or tea  _____________________________________________________________________    Take these medicines the morning of surgery with A SIP OF WATER: None                                 You may not have any metal on your body including hair pins and              piercings  Do not wear jewelry, make-up, lotions, powders or perfumes, deodorant             Do not wear nail polish.  Do not shave  48 hours prior to surgery.                Do not bring valuables to the hospital. [place].  Contacts, dentures or bridgework may not be worn into surgery.  Leave suitcase in the car. After surgery it may be brought to your room.     Please read over the following fact sheets you were given: _____________________________________________________________________             [facility] - Preparing for Surgery Before surgery, you can play an important role.  Because skin is not sterile, your skin needs to be as free of germs as possible.  You can reduce the number of germs on your skin by washing with CHG (chlorahexidine gluconate) soap before surgery.  CHG is an antiseptic cleaner which kills germs and bonds with the skin to continue killing germs even after washing. Please DO NOT use if you have an allergy to CHG or antibacterial soaps.  If your skin becomes reddened/irritated stop using the CHG and inform your nurse when you arrive at Short Stay. Do not shave (including legs and underarms) for at least 48 hours prior to the first CHG shower.  You may shave your face/neck. Please follow these instructions carefully:  1.  Shower with CHG Soap the night before surgery and the  morning of Surgery.  2.  If you choose to wash your hair, wash your hair first as usual with your  normal  shampoo.  3.  After  you shampoo, rinse your hair and body thoroughly to  remove the  shampoo.                           4.  Use CHG as you would any other liquid soap.  You can apply chg directly  to the skin and wash                       Gently with a scrungie or clean washcloth.  5.  Apply the CHG Soap to your body ONLY FROM THE NECK DOWN.   Do not use on face/ open                           Wound or open sores. Avoid contact with eyes, ears mouth and genitals (private parts).                       Wash face,  Genitals (private parts) with your normal soap.             6.  Wash thoroughly, paying special attention to the area where your surgery  will be performed.  7.  Thoroughly rinse your body with warm water from the neck down.  8.  DO NOT shower/wash with your normal soap after using and rinsing off  the CHG Soap.                9.  Pat yourself dry with a clean towel.            10.  Wear clean pajamas.            11.  Place clean sheets on your bed the night of your first shower and do not  sleep with pets. Day of Surgery : Do not apply any lotions/deodorants the morning of surgery.  Please wear clean clothes to the hospital/surgery center.  FAILURE TO FOLLOW THESE INSTRUCTIONS MAY RESULT IN THE CANCELLATION OF YOUR SURGERY PATIENT SIGNATURE_________________________________  NURSE SIGNATURE__________________________________  ________________________________________________________________________   Renee Anderson  An incentive spirometer is a tool that can help keep your lungs clear and active. This tool measures how well you are filling your lungs with each breath. Taking long deep breaths may help reverse or decrease the chance of developing breathing (pulmonary) problems (especially infection) following:  A long period of time when you are unable to move or be active. BEFORE THE PROCEDURE   If the spirometer includes an indicator to show your best effort, your nurse or respiratory therapist will set it to a desired goal.  If possible, sit  up straight or lean slightly forward. Try not to slouch.  Hold the incentive spirometer in an upright position. INSTRUCTIONS FOR USE  1. Sit on the edge of your bed if possible, or sit up as far as you can in bed or on a chair. 2. Hold the incentive spirometer in an upright position. 3. Breathe out normally. 4. Place the mouthpiece in your mouth and seal your lips tightly around it. 5. Breathe in slowly and as deeply as possible, raising the piston or the ball toward the top of the column. 6. Hold your breath for 3-5 seconds or for as long as possible. Allow the piston or ball to fall to the bottom of the column. 7. Remove the mouthpiece from your mouth and breathe out normally. 8. Rest for a  few seconds and repeat Steps 1 through 7 at least 10 times every 1-2 hours when you are awake. Take your time and take a few normal breaths between deep breaths. 9. The spirometer may include an indicator to show your best effort. Use the indicator as a goal to work toward during each repetition. 10. After each set of 10 deep breaths, practice coughing to be sure your lungs are clear. If you have an incision (the cut made at the time of surgery), support your incision when coughing by placing a pillow or rolled up towels firmly against it. Once you are able to get out of bed, walk around indoors and cough well. You may stop using the incentive spirometer when instructed by your caregiver.  RISKS AND COMPLICATIONS  Take your time so you do not get dizzy or light-headed.  If you are in pain, you may need to take or ask for pain medication before doing incentive spirometry. It is harder to take a deep breath if you are having pain. AFTER USE  Rest and breathe slowly and easily.  It can be helpful to keep track of a log of your progress. Your caregiver can provide you with a simple table to help with this. If you are using the spirometer at home, follow these instructions: [place] IF:   You are  having difficultly using the spirometer.  You have trouble using the spirometer as often as instructed.  Your pain medication is not giving enough relief while using the spirometer.  You develop fever of 100.5 F (38.1 C) or higher. SEEK IMMEDIATE MEDICAL CARE IF:   You cough up bloody sputum that had not been present before.  You develop fever of 102 F (38.9 C) or greater.  You develop worsening pain at or near the incision site. MAKE SURE YOU:   Understand these instructions.  Will watch your condition.  Will get help right away if you are not doing well or get worse. Document Released: 10/02/2006 Document Revised: 08/14/2011 Document Reviewed: 12/03/2006 Exit[employer] Patient Information 2014 Exit[employer], [place].   ________________________________________________________________________  WHAT IS A BLOOD TRANSFUSION? Blood Transfusion Information  A transfusion is the replacement of blood or some of its parts. Blood is made up of multiple cells which provide different functions.  Red blood cells carry oxygen and are used for blood loss replacement.  White blood cells fight against infection.  Platelets control bleeding.  Plasma helps clot blood.  Other blood products are available for specialized needs, such as hemophilia or other clotting disorders. BEFORE THE TRANSFUSION  Who gives blood for transfusions?   Healthy volunteers who are fully evaluated to make sure their blood is safe. This is blood bank blood. Transfusion therapy is the safest it has ever been in the practice of medicine. Before blood is taken from a donor, a complete history is taken to make sure that person has no history of diseases nor engages in risky social behavior (examples are intravenous drug use or sexual activity with multiple partners). The donor's travel history is screened to minimize risk of transmitting infections, such as malaria. The donated blood is tested for signs of infectious diseases,  such as HIV and hepatitis. The blood is then tested to be sure it is compatible with you in order to minimize the chance of a transfusion reaction. If you or a relative donates blood, this is often done in anticipation of surgery and is not appropriate for emergency situations. It takes many days to  process the donated blood. RISKS AND COMPLICATIONS Although transfusion therapy is very safe and saves many lives, the main dangers of transfusion include:   Getting an infectious disease.  Developing a transfusion reaction. This is an allergic reaction to something in the blood you were given. Every precaution is taken to prevent this. The decision to have a blood transfusion has been considered carefully by your caregiver before blood is given. Blood is not given unless the benefits outweigh the risks. AFTER THE TRANSFUSION  Right after receiving a blood transfusion, you will usually feel much better and more energetic. This is especially true if your red blood cells have gotten low (anemic). The transfusion raises the level of the red blood cells which carry oxygen, and this usually causes an energy increase.  The nurse administering the transfusion will monitor you carefully for complications. HOME CARE INSTRUCTIONS  No special instructions are needed after a transfusion. You may find your energy is better. Speak with your caregiver about any limitations on activity for underlying diseases you may have. SEEK MEDICAL CARE IF:   Your condition is not improving after your transfusion.  You develop redness or irritation at the intravenous (IV) site. SEEK IMMEDIATE MEDICAL CARE IF:  Any of the following symptoms occur over the next 12 hours:  Shaking chills.  You have a temperature by mouth above 102 F (38.9 C), not controlled by medicine.  Chest, back, or muscle pain.  People around you feel you are not acting correctly or are confused.  Shortness of breath or difficulty  breathing.  Dizziness and fainting.  You get a rash or develop hives.  You have a decrease in urine output.  Your urine turns a dark color or changes to pink, red, or brown. Any of the following symptoms occur over the next 10 days:  You have a temperature by mouth above 102 F (38.9 C), not controlled by medicine.  Shortness of breath.  Weakness after normal activity.  The white part of the eye turns yellow (jaundice).  You have a decrease in the amount of urine or are urinating less often.  Your urine turns a dark color or changes to pink, red, or brown. Document Released: 05/19/2000 Document Revised: 08/14/2011 Document Reviewed: 01/06/2008 [facility] Patient Information 2014 [place], [place].  _______________________________________________________________________

## 2017-01-19 ENCOUNTER — Encounter (HOSPITAL_COMMUNITY)
Admission: RE | Admit: 2017-01-19 | Discharge: 2017-01-19 | Disposition: A | Discharge status: Home / Self Care | Payer: BLUE CROSS/BLUE SHIELD | Source: Ambulatory Visit | Attending: Gynecologic Oncology | Admitting: Gynecologic Oncology

## 2017-01-19 ENCOUNTER — Encounter (HOSPITAL_COMMUNITY): Payer: Self-pay

## 2017-01-19 DIAGNOSIS — Z01812 Encounter for preprocedural laboratory examination: Secondary | ICD-10-CM | POA: Diagnosis present

## 2017-01-19 DIAGNOSIS — C541 Malignant neoplasm of endometrium: Secondary | ICD-10-CM | POA: Diagnosis not present

## 2017-01-19 LAB — COMPREHENSIVE METABOLIC PANEL
ALT: 15 U/L (ref 14–54)
AST: 21 U/L (ref 15–41)
Albumin: 3.7 g/dL (ref 3.5–5.0)
Alkaline Phosphatase: 60 U/L (ref 38–126)
Anion gap: 7 (ref 5–15)
BILIRUBIN TOTAL: 0.5 mg/dL (ref 0.3–1.2)
BUN: 13 mg/dL (ref 6–20)
CO2: 27 mmol/L (ref 22–32)
CREATININE: 0.68 mg/dL (ref 0.44–1.00)
Calcium: 9 mg/dL (ref 8.9–10.3)
Chloride: 108 mmol/L (ref 101–111)
GFR calc Af Amer: >60 mL/min (ref >60)
Glucose, Bld: 91 mg/dL (ref 65–99)
POTASSIUM: 4.3 mmol/L (ref 3.5–5.1)
Sodium: 142 mmol/L (ref 135–145)
TOTAL PROTEIN: 6.2 g/dL — AB (ref 6.5–8.1)

## 2017-01-19 LAB — CBC
HEMATOCRIT: 38.3 % (ref 36.0–46.0)
Hemoglobin: 13.3 g/dL (ref 12.0–15.0)
MCH: 33.9 pg (ref 26.0–34.0)
MCHC: 34.7 g/dL (ref 30.0–36.0)
MCV: 97.7 fL (ref 78.0–100.0)
Platelets: 234 10*3/uL (ref 150–400)
RBC: 3.92 MIL/uL (ref 3.87–5.11)
RDW: 13.6 % (ref 11.5–15.5)
WBC: 5.8 10*3/uL (ref 4.0–10.5)

## 2017-01-19 LAB — URINALYSIS, ROUTINE W REFLEX MICROSCOPIC
Bilirubin Urine: NEGATIVE
Glucose, UA: NEGATIVE mg/dL
KETONES UR: NEGATIVE mg/dL
Leukocytes, UA: NEGATIVE
Nitrite: NEGATIVE
PROTEIN: NEGATIVE mg/dL
Specific Gravity, Urine: 1.008 (ref 1.005–1.030)
pH: 5 (ref 5.0–8.0)

## 2017-01-19 LAB — ABO/RH: ABO/RH(D): A POS

## 2017-01-19 LAB — PREGNANCY, URINE: Preg Test, Ur: NEGATIVE

## 2017-01-19 NOTE — Progress Notes (Signed)
01-19-17 UA results routed to Dr. Denman George for review

## 2017-01-25 ENCOUNTER — Encounter (HOSPITAL_COMMUNITY)
Admission: RE | Disposition: A | Discharge status: Home / Self Care | Payer: Self-pay | Source: Ambulatory Visit | Attending: Gynecologic Oncology

## 2017-01-25 ENCOUNTER — Ambulatory Visit (HOSPITAL_COMMUNITY): Payer: BLUE CROSS/BLUE SHIELD | Admitting: Anesthesiology

## 2017-01-25 ENCOUNTER — Encounter (HOSPITAL_COMMUNITY): Payer: Self-pay | Admitting: *Deleted

## 2017-01-25 ENCOUNTER — Ambulatory Visit (HOSPITAL_COMMUNITY)
Admission: RE | Admit: 2017-01-25 | Discharge: 2017-01-26 | Disposition: A | Discharge status: Home / Self Care | Payer: BLUE CROSS/BLUE SHIELD | Source: Ambulatory Visit | Attending: Gynecologic Oncology | Admitting: Gynecologic Oncology

## 2017-01-25 ENCOUNTER — Telehealth: Payer: Self-pay | Admitting: *Deleted

## 2017-01-25 DIAGNOSIS — Z79899 Other long term (current) drug therapy: Secondary | ICD-10-CM | POA: Insufficient documentation

## 2017-01-25 DIAGNOSIS — Z6836 Body mass index (BMI) 36.0-36.9, adult: Secondary | ICD-10-CM | POA: Diagnosis not present

## 2017-01-25 DIAGNOSIS — C541 Malignant neoplasm of endometrium: Secondary | ICD-10-CM | POA: Diagnosis present

## 2017-01-25 DIAGNOSIS — Z7951 Long term (current) use of inhaled steroids: Secondary | ICD-10-CM | POA: Insufficient documentation

## 2017-01-25 DIAGNOSIS — E669 Obesity, unspecified: Secondary | ICD-10-CM | POA: Diagnosis not present

## 2017-01-25 DIAGNOSIS — F17210 Nicotine dependence, cigarettes, uncomplicated: Secondary | ICD-10-CM | POA: Diagnosis not present

## 2017-01-25 DIAGNOSIS — N83201 Unspecified ovarian cyst, right side: Secondary | ICD-10-CM | POA: Insufficient documentation

## 2017-01-25 DIAGNOSIS — N838 Other noninflammatory disorders of ovary, fallopian tube and broad ligament: Secondary | ICD-10-CM | POA: Insufficient documentation

## 2017-01-25 HISTORY — PX: ROBOTIC ASSISTED TOTAL HYSTERECTOMY WITH BILATERAL SALPINGO OOPHERECTOMY: SHX6086

## 2017-01-25 HISTORY — PX: SENTINEL NODE BIOPSY: SHX6608

## 2017-01-25 LAB — TYPE AND SCREEN
ABO/RH(D): A POS
Antibody Screen: NEGATIVE

## 2017-01-25 SURGERY — ROBOTIC ASSISTED TOTAL HYSTERECTOMY WITH BILATERAL SALPINGO OOPHORECTOMY
Anesthesia: General | Laterality: Bilateral

## 2017-01-25 MED ORDER — LACTATED RINGERS IV SOLN
INTRAVENOUS | Status: DC
  Administered 2017-01-25 (×2): via INTRAVENOUS

## 2017-01-25 MED ORDER — CEFAZOLIN SODIUM-DEXTROSE 2-4 GM/100ML-% IV SOLN
2.0000 g | INTRAVENOUS | Status: AC
  Administered 2017-01-25: 2 g via INTRAVENOUS
  Filled 2017-01-25: qty 100

## 2017-01-25 MED ORDER — DEXAMETHASONE SODIUM PHOSPHATE 10 MG/ML IJ SOLN
INTRAMUSCULAR | Status: AC
  Filled 2017-01-25: qty 1

## 2017-01-25 MED ORDER — HYDROMORPHONE HCL-NACL 0.5-0.9 MG/ML-% IV SOSY
0.2500 mg | PREFILLED_SYRINGE | INTRAVENOUS | Status: DC | PRN
  Administered 2017-01-25 (×3): 0.5 mg via INTRAVENOUS

## 2017-01-25 MED ORDER — ACETAMINOPHEN 10 MG/ML IV SOLN
INTRAVENOUS | Status: AC
  Filled 2017-01-25: qty 100

## 2017-01-25 MED ORDER — METOCLOPRAMIDE HCL 5 MG/ML IJ SOLN: 10.0000 mg | Freq: Once | INTRAMUSCULAR | Status: DC | PRN

## 2017-01-25 MED ORDER — LACTATED RINGERS IR SOLN
Status: DC | PRN
  Administered 2017-01-25: 1000 mL

## 2017-01-25 MED ORDER — GABAPENTIN 300 MG PO CAPS
600.0000 mg | ORAL_CAPSULE | Freq: Every day | ORAL | Status: AC
  Administered 2017-01-25: 600 mg via ORAL
  Filled 2017-01-25: qty 2

## 2017-01-25 MED ORDER — STERILE WATER FOR IRRIGATION IR SOLN
Status: DC | PRN
  Administered 2017-01-25: 1000 mL

## 2017-01-25 MED ORDER — PROPOFOL 10 MG/ML IV BOLUS
INTRAVENOUS | Status: AC
  Filled 2017-01-25: qty 20

## 2017-01-25 MED ORDER — PROPOFOL 10 MG/ML IV BOLUS
INTRAVENOUS | Status: DC | PRN
  Administered 2017-01-25: 200 mg via INTRAVENOUS

## 2017-01-25 MED ORDER — ROCURONIUM BROMIDE 100 MG/10ML IV SOLN
INTRAVENOUS | Status: DC | PRN
  Administered 2017-01-25: 20 mg via INTRAVENOUS
  Administered 2017-01-25: 50 mg via INTRAVENOUS

## 2017-01-25 MED ORDER — MEPERIDINE HCL 50 MG/ML IJ SOLN: 6.2500 mg | INTRAMUSCULAR | Status: DC | PRN

## 2017-01-25 MED ORDER — STERILE WATER FOR INJECTION IJ SOLN
INTRAMUSCULAR | Status: AC
  Filled 2017-01-25: qty 10

## 2017-01-25 MED ORDER — ENOXAPARIN SODIUM 40 MG/0.4ML ~~LOC~~ SOLN
40.0000 mg | SUBCUTANEOUS | Status: AC
  Administered 2017-01-25: 40 mg via SUBCUTANEOUS
  Filled 2017-01-25: qty 0.4

## 2017-01-25 MED ORDER — SCOPOLAMINE 1 MG/3DAYS TD PT72
MEDICATED_PATCH | TRANSDERMAL | Status: DC | PRN
  Administered 2017-01-25: 1 via TRANSDERMAL

## 2017-01-25 MED ORDER — HYDROMORPHONE HCL-NACL 0.5-0.9 MG/ML-% IV SOSY
0.2000 mg | PREFILLED_SYRINGE | INTRAVENOUS | Status: DC | PRN
  Administered 2017-01-25: 0.5 mg via INTRAVENOUS
  Filled 2017-01-25: qty 1

## 2017-01-25 MED ORDER — KCL IN DEXTROSE-NACL 20-5-0.45 MEQ/L-%-% IV SOLN
INTRAVENOUS | Status: DC
  Administered 2017-01-25: 22:00:00 via INTRAVENOUS
  Filled 2017-01-25: qty 1000

## 2017-01-25 MED ORDER — SCOPOLAMINE 1 MG/3DAYS TD PT72
MEDICATED_PATCH | TRANSDERMAL | Status: AC
  Filled 2017-01-25: qty 1

## 2017-01-25 MED ORDER — ONDANSETRON HCL 4 MG/2ML IJ SOLN
INTRAMUSCULAR | Status: DC | PRN
  Administered 2017-01-25: 4 mg via INTRAVENOUS

## 2017-01-25 MED ORDER — EPHEDRINE SULFATE 50 MG/ML IJ SOLN
INTRAMUSCULAR | Status: DC | PRN
  Administered 2017-01-25: 10 mg via INTRAVENOUS

## 2017-01-25 MED ORDER — KETOROLAC TROMETHAMINE 30 MG/ML IJ SOLN
INTRAMUSCULAR | Status: AC
  Filled 2017-01-25: qty 1

## 2017-01-25 MED ORDER — ACETAMINOPHEN 10 MG/ML IV SOLN
1000.0000 mg | Freq: Once | INTRAVENOUS | Status: AC
  Administered 2017-01-25: 1000 mg via INTRAVENOUS

## 2017-01-25 MED ORDER — ONDANSETRON HCL 4 MG/2ML IJ SOLN: 4.0000 mg | Freq: Four times a day (QID) | INTRAMUSCULAR | Status: DC | PRN

## 2017-01-25 MED ORDER — ONDANSETRON HCL 4 MG/2ML IJ SOLN
INTRAMUSCULAR | Status: AC
  Filled 2017-01-25: qty 2

## 2017-01-25 MED ORDER — SENNOSIDES-DOCUSATE SODIUM 8.6-50 MG PO TABS
2.0000 | ORAL_TABLET | Freq: Every day | ORAL | Status: DC
  Administered 2017-01-25: 2 via ORAL
  Filled 2017-01-25: qty 2

## 2017-01-25 MED ORDER — LIDOCAINE 2% (20 MG/ML) 5 ML SYRINGE
INTRAMUSCULAR | Status: AC
  Filled 2017-01-25: qty 5

## 2017-01-25 MED ORDER — SUGAMMADEX SODIUM 200 MG/2ML IV SOLN
INTRAVENOUS | Status: DC | PRN
  Administered 2017-01-25: 200 mg via INTRAVENOUS

## 2017-01-25 MED ORDER — OXYCODONE-ACETAMINOPHEN 5-325 MG PO TABS: 1.0000 | ORAL_TABLET | ORAL | Status: DC | PRN

## 2017-01-25 MED ORDER — FENTANYL CITRATE (PF) 100 MCG/2ML IJ SOLN
INTRAMUSCULAR | Status: AC
  Filled 2017-01-25: qty 2

## 2017-01-25 MED ORDER — LIDOCAINE HCL (CARDIAC) 20 MG/ML IV SOLN
INTRAVENOUS | Status: DC | PRN
  Administered 2017-01-25: 100 mg via INTRAVENOUS

## 2017-01-25 MED ORDER — KETOROLAC TROMETHAMINE 30 MG/ML IJ SOLN
30.0000 mg | Freq: Once | INTRAMUSCULAR | Status: AC
  Administered 2017-01-25: 30 mg via INTRAVENOUS

## 2017-01-25 MED ORDER — DEXAMETHASONE SODIUM PHOSPHATE 10 MG/ML IJ SOLN
INTRAMUSCULAR | Status: DC | PRN
  Administered 2017-01-25: 10 mg via INTRAVENOUS

## 2017-01-25 MED ORDER — FENTANYL CITRATE (PF) 100 MCG/2ML IJ SOLN
INTRAMUSCULAR | Status: DC | PRN
  Administered 2017-01-25 (×5): 50 ug via INTRAVENOUS
  Administered 2017-01-25: 100 ug via INTRAVENOUS

## 2017-01-25 MED ORDER — FENTANYL CITRATE (PF) 250 MCG/5ML IJ SOLN
INTRAMUSCULAR | Status: AC
  Filled 2017-01-25: qty 5

## 2017-01-25 MED ORDER — MIDAZOLAM HCL 2 MG/2ML IJ SOLN
INTRAMUSCULAR | Status: AC
  Filled 2017-01-25: qty 2

## 2017-01-25 MED ORDER — HYDROMORPHONE HCL-NACL 0.5-0.9 MG/ML-% IV SOSY
PREFILLED_SYRINGE | INTRAVENOUS | Status: AC
  Filled 2017-01-25: qty 4

## 2017-01-25 MED ORDER — ROCURONIUM BROMIDE 50 MG/5ML IV SOSY
PREFILLED_SYRINGE | INTRAVENOUS | Status: AC
  Filled 2017-01-25: qty 5

## 2017-01-25 MED ORDER — DICLOFENAC SODIUM 50 MG PO TBEC
50.0000 mg | DELAYED_RELEASE_TABLET | Freq: Four times a day (QID) | ORAL | Status: DC
  Administered 2017-01-25 – 2017-01-26 (×2): 50 mg via ORAL
  Filled 2017-01-25 (×3): qty 1

## 2017-01-25 MED ORDER — ONDANSETRON HCL 4 MG PO TABS: 4.0000 mg | ORAL_TABLET | Freq: Four times a day (QID) | ORAL | Status: DC | PRN

## 2017-01-25 MED ORDER — STERILE WATER FOR INJECTION IJ SOLN
INTRAMUSCULAR | Status: DC | PRN
Start: 2017-01-25 — End: 2017-01-25
  Administered 2017-01-25: 4 mL

## 2017-01-25 MED ORDER — ENOXAPARIN SODIUM 40 MG/0.4ML ~~LOC~~ SOLN
40.0000 mg | SUBCUTANEOUS | Status: DC
  Administered 2017-01-26: 40 mg via SUBCUTANEOUS
  Filled 2017-01-25: qty 0.4

## 2017-01-25 MED ORDER — MIDAZOLAM HCL 5 MG/5ML IJ SOLN
INTRAMUSCULAR | Status: DC | PRN
  Administered 2017-01-25: 2 mg via INTRAVENOUS

## 2017-01-25 MED ORDER — ROCURONIUM BROMIDE 50 MG/5ML IV SOSY
PREFILLED_SYRINGE | INTRAVENOUS | Status: AC
  Filled 2017-01-25: qty 20

## 2017-01-25 SURGICAL SUPPLY — 49 items
AGENT HMST KT MTR STRL THRMB (HEMOSTASIS)
APL ESCP 34 STRL LF DISP (HEMOSTASIS)
APPLICATOR SURGIFLO ENDO (HEMOSTASIS) IMPLANT
BAG LAPAROSCOPIC 12 15 PORT 16 (BASKET) IMPLANT
BAG RETRIEVAL 12/15 (BASKET)
BAG SPEC RTRVL LRG 6X4 10 (ENDOMECHANICALS)
COVER BACK TABLE 60X90IN (DRAPES) ×2 IMPLANT
COVER TIP SHEARS 8 DVNC (MISCELLANEOUS) ×1 IMPLANT
COVER TIP SHEARS 8MM DA VINCI (MISCELLANEOUS) ×1
DRAPE ARM DVNC X/XI (DISPOSABLE) ×4 IMPLANT
DRAPE COLUMN DVNC XI (DISPOSABLE) ×1 IMPLANT
DRAPE DA VINCI XI ARM (DISPOSABLE) ×4
DRAPE DA VINCI XI COLUMN (DISPOSABLE) ×1
DRAPE SHEET LG 3/4 BI-LAMINATE (DRAPES) ×2 IMPLANT
DRAPE SURG IRRIG POUCH 19X23 (DRAPES) ×2 IMPLANT
ELECT REM PT RETURN 15FT ADLT (MISCELLANEOUS) ×2 IMPLANT
GLOVE BIO SURGEON STRL SZ 6 (GLOVE) ×8 IMPLANT
GLOVE BIO SURGEON STRL SZ 6.5 (GLOVE) ×4 IMPLANT
GOWN STRL REUS W/ TWL LRG LVL3 (GOWN DISPOSABLE) ×2 IMPLANT
GOWN STRL REUS W/TWL LRG LVL3 (GOWN DISPOSABLE) ×4
HOLDER FOLEY CATH W/STRAP (MISCELLANEOUS) ×2 IMPLANT
IRRIG SUCT STRYKERFLOW 2 WTIP (MISCELLANEOUS) ×2
IRRIGATION SUCT STRKRFLW 2 WTP (MISCELLANEOUS) ×1 IMPLANT
KIT PROCEDURE DA VINCI SI (MISCELLANEOUS) ×1
KIT PROCEDURE DVNC SI (MISCELLANEOUS) IMPLANT
MANIPULATOR UTERINE 4.5 ZUMI (MISCELLANEOUS) ×2 IMPLANT
NDL SAFETY ECLIPSE 18X1.5 (NEEDLE) ×1 IMPLANT
NDL SPNL 18GX3.5 QUINCKE PK (NEEDLE) ×1 IMPLANT
NEEDLE HYPO 18GX1.5 SHARP (NEEDLE) ×2
NEEDLE SPNL 18GX3.5 QUINCKE PK (NEEDLE) ×2 IMPLANT
OBTURATOR OPTICAL STANDARD 8MM (TROCAR) ×1
OBTURATOR OPTICAL STND 8 DVNC (TROCAR) ×1
OBTURATOR OPTICALSTD 8 DVNC (TROCAR) ×1 IMPLANT
PACK ROBOT GYN CUSTOM WL (TRAY / TRAY PROCEDURE) ×2 IMPLANT
PAD POSITIONING PINK XL (MISCELLANEOUS) ×2 IMPLANT
POUCH SPECIMEN RETRIEVAL 10MM (ENDOMECHANICALS) IMPLANT
SEAL CANN UNIV 5-8 DVNC XI (MISCELLANEOUS) ×4 IMPLANT
SEAL XI 5MM-8MM UNIVERSAL (MISCELLANEOUS) ×4
SET TRI-LUMEN FLTR TB AIRSEAL (TUBING) ×2 IMPLANT
SOLUTION ELECTROLUBE (MISCELLANEOUS) ×1 IMPLANT
SURGIFLO W/THROMBIN 8M KIT (HEMOSTASIS) IMPLANT
SUT VIC AB 0 CT1 27 (SUTURE)
SUT VIC AB 0 CT1 27XBRD ANTBC (SUTURE) IMPLANT
SYR 10ML LL (SYRINGE) ×2 IMPLANT
TOWEL OR NON WOVEN STRL DISP B (DISPOSABLE) ×2 IMPLANT
TRAP SPECIMEN MUCOUS 40CC (MISCELLANEOUS) IMPLANT
TRAY FOLEY W/METER SILVER 16FR (SET/KITS/TRAYS/PACK) ×2 IMPLANT
UNDERPAD 30X30 (UNDERPADS AND DIAPERS) ×3 IMPLANT
WATER STERILE IRR 1000ML POUR (IV SOLUTION) ×3 IMPLANT

## 2017-01-25 NOTE — Op Note (Signed)
OPERATIVE NOTE 01/25/17  Surgeon: Donaciano Eva   Assistants: Dr Lahoma Crocker (an MD assistant was necessary for tissue manipulation, management of robotic instrumentation, retraction and positioning due to the complexity of the case and hospital policies).   Anesthesia: General endotracheal anesthesia  ASA Class: 3   Pre-operative Diagnosis: grade 1 endometrial cancer  Post-operative Diagnosis: same  Operation: Robotic-assisted laparoscopic total hysterectomy with bilateral salpingoophorectomy, SLN mapping and biopsy  Surgeon: Donaciano Eva  Assistant Surgeon: Lahoma Crocker MD  Anesthesia: GET  Urine Output: 300cc  Operative Findings:  : 8cm uterus with normal ovaries and no suspicious nodes  Estimated Blood Loss:  <20      Total IV Fluids: 700 ml         Specimens: right obturator SLN, left external iliac SLN, uterus with cervix, bilateral tubes and ovares         Complications:  None; patient tolerated the procedure well.         Disposition: PACU - hemodynamically stable.  Procedure Details  The patient was seen in the Holding Room. The risks, benefits, complications, treatment options, and expected outcomes were discussed with the patient.  The patient concurred with the proposed plan, giving informed consent.  The site of surgery properly noted/marked. The patient was identified as Renee Anderson and the procedure verified as a Robotic-assisted hysterectomy with bilateral salpingo oophorectomy and SLN biopsy. A Time Out was held and the above information confirmed.  After induction of anesthesia, the patient was draped and prepped in the usual sterile manner. Pt was placed in supine position after anesthesia and draped and prepped in the usual sterile manner. The abdominal drape was placed after the CholoraPrep had been allowed to dry for 3 minutes.  Her arms were tucked to her side with all appropriate precautions.  The shoulders were stabilized  with padded shoulder blocks applied to the acromium processes.  The patient was placed in the semi-lithotomy position in [place].  The perineum was prepped with Betadine. The patient was then prepped. Foley catheter was placed.  A sterile speculum was placed in the vagina.  The cervix was grasped with a single-tooth tenaculum and dilated with Kennon Rounds dilators. 1mg  total of ICG was injected into the cervical stroma at 2 and 9 o'clock at a 50mm depth (concentration 0..5mg /ml).  The ZUMI uterine manipulator with a medium colpotomizer ring was placed without difficulty.  A pneum occluder balloon was placed over the manipulator.  OG tube placement was confirmed and to suction.   Next, a 5 mm skin incision was made 1 cm below the subcostal margin in the midclavicular line.  The 5 mm Optiview port and scope was used for direct entry.  Opening pressure was under 10 mm CO2.  The abdomen was insufflated and the findings were noted as above.   At this point and all points during the procedure, the patient's intra-abdominal pressure did not exceed 15 mmHg. Next, a 10 mm skin incision was made in the umbilicus and a right and left port was placed about 10 cm lateral to the robot port on the right and left side.  A fourth arm was placed in the left lower quadrant 2 cm above and superior and medial to the anterior superior iliac spine.  All ports were placed under direct visualization.  The patient was placed in steep Trendelenburg.  Bowel was folded away into the upper abdomen.  The robot was docked in the normal manner.  The right and  left peritoneum were opened parallel to the IP ligament to open the retroperitoneal spaces bilaterally. The SLN mapping was performed in bilateral pelvic basins. The para rectal and paravesical spaces were opened up. Lymphatic channels were identified travelling to the following visualized sentinel lymph node's: right obturator and left external iliac. These SLN's were separated from their  surrounding lymphatic tissue, removed and sent for permanent pathology.  The hysterectomy was started after the round ligament on the right side was incised and the retroperitoneum was entered and the pararectal space was developed.  The ureter was noted to be on the medial leaf of the broad ligament.  The peritoneum above the ureter was incised and stretched and the infundibulopelvic ligament was skeletonized, cauterized and cut.  The posterior peritoneum was taken down to the level of the KOH ring.  The anterior peritoneum was also taken down.  The bladder flap was created to the level of the KOH ring.  The uterine artery on the right side was skeletonized, cauterized and cut in the normal manner.  A similar procedure was performed on the left.  The colpotomy was made and the uterus, cervix, bilateral ovaries and tubes were amputated and delivered through the vagina.  Pedicles were inspected and excellent hemostasis was achieved.    The colpotomy at the vaginal cuff was closed with Vicryl on a CT1 needle in a running manner.  Irrigation was used and excellent hemostasis was achieved.  At this point in the procedure was completed.  Robotic instruments were removed under direct visulaization.  The robot was undocked. The 10 mm ports were closed with Vicryl on a UR-5 needle and the fascia was closed with 0 Vicryl on a UR-5 needle.  The skin was closed with 4-0 Vicryl in a subcuticular manner.  Dermabond was applied.  Sponge, lap and needle counts correct x 2.  The patient was taken to the recovery room in stable condition.  The vagina was swabbed with  minimal bleeding noted.   All instrument and needle counts were correct x  3.   The patient was transferred to the recovery room in a stable condition.  Donaciano Eva, MD

## 2017-01-25 NOTE — Transfer of Care (Signed)
Immediate Anesthesia Transfer of Care Note  Patient: Renee Anderson  Procedure(s) Performed: Procedure(s): ROBOTIC ASSISTED TOTAL HYSTERECTOMY WITH BILATERAL SALPINGO OOPHORECTOMY (Bilateral) SENTINEL NODE BIOPSY (Bilateral)  Patient Location: PACU  Anesthesia Type:General  Level of Consciousness: awake, alert  and oriented  Airway & Oxygen Therapy: Patient Spontanous Breathing and Patient connected to face mask oxygen  Post-op Assessment: Report given to RN and Post -op Vital signs reviewed and stable  Post vital signs: Reviewed and stable  Last Vitals:  Vitals:   01/25/17 1109  BP: 118/79  Pulse: 83  Resp: 18  Temp: 36.8 C  SpO2: 97%    Last Pain:  Vitals:   01/25/17 1109  TempSrc: Oral      Patients Stated Pain Goal: 4 (91/69/45 0388)  Complications: No apparent anesthesia complications

## 2017-01-25 NOTE — Interval H&P Note (Signed)
History and Physical Interval Note:  01/25/2017 2:11 PM  Renee Anderson  has presented today for surgery, with the diagnosis of ENDOMETRIAL CANCER  The various methods of treatment have been discussed with the patient and family. After consideration of risks, benefits and other options for treatment, the patient has consented to  Procedure(s): ROBOTIC ASSISTED TOTAL HYSTERECTOMY WITH BILATERAL SALPINGO OOPHORECTOMY (Bilateral) SENTINEL NODE BIOPSY (Bilateral) as a surgical intervention .  The patient's history has been reviewed, patient examined, no change in status, stable for surgery.  I have reviewed the patient's chart and labs.  Questions were answered to the patient's satisfaction.     Donaciano Eva

## 2017-01-25 NOTE — Anesthesia Postprocedure Evaluation (Signed)
Anesthesia Post Note  Patient: Renee Anderson  Procedure(s) Performed: Procedure(s) (LRB): ROBOTIC ASSISTED TOTAL HYSTERECTOMY WITH BILATERAL SALPINGO OOPHORECTOMY (Bilateral) SENTINEL NODE BIOPSY (Bilateral)     Patient location during evaluation: PACU Anesthesia Type: General Level of consciousness: awake and alert and oriented Pain management: pain level controlled Vital Signs Assessment: post-procedure vital signs reviewed and stable Respiratory status: spontaneous breathing, nonlabored ventilation, respiratory function stable and patient connected to nasal cannula oxygen Cardiovascular status: blood pressure returned to baseline and stable Postop Assessment: no signs of nausea or vomiting Anesthetic complications: no    Last Vitals:  Vitals:   01/25/17 1645 01/25/17 1700  BP: 105/80 118/70  Pulse: (!) 55 (!) 51  Resp: 12 12  Temp:    SpO2: 100% 100%    Last Pain:  Vitals:   01/25/17 1705  TempSrc:   PainSc: Asleep                 Uday Jantz A.

## 2017-01-25 NOTE — Anesthesia Preprocedure Evaluation (Addendum)
Anesthesia Evaluation  Patient identified by MRN, date of birth, ID band Patient awake    Reviewed: Allergy & Precautions, NPO status , Patient's Chart, lab work & pertinent test results  Airway Mallampati: II  TM Distance: >3 FB Neck ROM: Full    Dental no notable dental hx. (+) Teeth Intact   Pulmonary Current Smoker,    Pulmonary exam normal breath sounds clear to auscultation       Cardiovascular negative cardio ROS Normal cardiovascular exam Rhythm:Regular Rate:Normal     Neuro/Psych negative neurological ROS  negative psych ROS   GI/Hepatic negative GI ROS, Neg liver ROS,   Endo/Other  Obesity  Renal/GU negative Renal ROS  negative genitourinary   Musculoskeletal negative musculoskeletal ROS (+)   Abdominal (+) + obese,   Peds  Hematology  (+) anemia , Pernicious anemia   Anesthesia Other Findings   Reproductive/Obstetrics Endometrial Ca                             Anesthesia Physical Anesthesia Plan  ASA: II  Anesthesia Plan: General   Post-op Pain Management:    Induction: Intravenous  PONV Risk Score and Plan: 4 or greater and Ondansetron, Dexamethasone, Midazolam, Scopolamine patch - Pre-op and Propofol infusion  Airway Management Planned: Oral ETT  Additional Equipment:   Intra-op Plan:   Post-operative Plan: Extubation in OR  Informed Consent: I have reviewed the patients History and Physical, chart, labs and discussed the procedure including the risks, benefits and alternatives for the proposed anesthesia with the patient or authorized representative who has indicated his/her understanding and acceptance.   Dental advisory given  Plan Discussed with: CRNA, Anesthesiologist and Surgeon  Anesthesia Plan Comments:         Anesthesia Quick Evaluation

## 2017-01-25 NOTE — H&P (View-Only) (Signed)
Consult Note: Gyn-Onc  Consult was requested by Dr. Harrington Challenger for the evaluation of DEAZIA LAMPI 56 y.o. female  CC:  Chief Complaint  Patient presents with  . Adenocarcinoma    FIGO I    Assessment/Plan:  Ms. TANDA MORRISSEY  is a 56 y.o.  year old with grade 1 endometrial cancer. A detailed discussion was held with the patient and her family with regard to to her endometrial cancer diagnosis. We discussed the standard management options for uterine cancer which includes surgery followed possibly by adjuvant therapy depending on the results of surgery. The options for surgical management include a hysterectomy and removal of the tubes and ovaries possibly with removal of pelvic and para-aortic lymph nodes.If feasible, a minimally invasive approach including a robotic hysterectomy or laparoscopic hysterectomy have benefits including shorter hospital stay, recovery time and better wound healing than with open surgery. The patient has been counseled about these surgical options and the risks of surgery in general including infection, bleeding, damage to surrounding structures (including bowel, bladder, ureters, nerves or vessels), and the postoperative risks of PE/ DVT, and lymphedema. I extensively reviewed the additional risks of robotic hysterectomy including possible need for conversion to open laparotomy.  I discussed positioning during surgery of trendelenberg and risks of minor facial swelling and care we take in preoperative positioning.  After counseling and consideration of her options, she desires to proceed with robotic assisted total hysterectomy with bilateral sapingo-oophorectomy and SLN biopsy.   She will be seen by anesthesia for preoperative clearance and discussion of postoperative pain management.  She was given the opportunity to ask questions, which were answered to her satisfaction, and she is agreement with the above mentioned plan of care.   HPI: Angeleah Labrake is a 56 year old  G2P2 who is seen in consultation at the request for Dr Vanessa Kick for grade 1 endometrial cancer. The patient has a history of abnormal uterine bleeding (remote) that has been well controlled with a Mirena IUD for many years (most recent one placed February, 2014). She began developing new vaginal spotting in May 2018 and was seen by Dr Harrington Challenger who performed an [place] on 12/25/16. This revealed a uterus measuring 5.4x4x4cm and a thickened stripe of 4cm. The adnexa were normal.  An office endometrial biopsy was performed which revealed grade 1 endometrioid endometrial cancer.  The patient's only family history is a paternal aunt with lung cancer (smoker). Her only prior surgeries were c/s x 2 and open cholecystectomy. She is otherwise very healthy.   Current Meds:  Outpatient Encounter Prescriptions as of 01/17/2017  Medication Sig  . cetirizine (ZYRTEC) 10 MG tablet Take 1 tablet (10 mg total) by mouth daily.  . cyanocobalamin (,VITAMIN B-12,) 1000 MCG/ML injection Inject 1 mL (1,000 mcg total) into the muscle every 14 (fourteen) days.  . fluticasone (FLONASE) 50 MCG/ACT nasal spray Place 2 sprays into both nostrils daily.  . furosemide (LASIX) 10 MG/ML solution Take by mouth as needed.  . Guaifenesin (MUCINEX MAXIMUM STRENGTH) 1200 MG TB12 Take 1 tablet (1,200 mg total) by mouth every 12 (twelve) hours as needed.  Marland Kitchen ibuprofen (ADVIL,MOTRIN) 200 MG tablet Take 200 mg by mouth every 6 (six) hours as needed for mild pain.  Marland Kitchen ipratropium (ATROVENT) 0.03 % nasal spray Place 2 sprays into both nostrils 2 (two) times daily.  . prenatal vitamin w/FE, FA (PRENATAL 1 + 1) 27-1 MG TABS TAKE ONE TABLET BY MOUTH EVERY DAY  . [DISCONTINUED] albuterol (PROVENTIL HFA;VENTOLIN  HFA) 108 (90 Base) MCG/ACT inhaler Inhale 2 puffs into the lungs every 4 (four) hours as needed for wheezing or shortness of breath (cough, shortness of breath or wheezing.). (Patient not taking: Reported on 01/17/2017)  . [DISCONTINUED]  levofloxacin (LEVAQUIN) 750 MG tablet Take 1 tablet (750 mg total) by mouth daily. (Patient not taking: Reported on 01/17/2017)  . [DISCONTINUED] predniSONE (DELTASONE) 20 MG tablet Take 3 tabs qd x 3d, then 2 tabs qd x 3d then 1 tab qd x 3d. (Patient not taking: Reported on 01/17/2017)   No facility-administered encounter medications on file as of 01/17/2017.     Allergy: No Known Allergies  Social Hx:   Social History   Social History  . Marital status: Single    Spouse name: N/A  . Number of children: N/A  . Years of education: N/A   Occupational History  . Not on file.   Social History Main Topics  . Smoking status: Current Every Day Smoker    Packs/day: 0.50    Years: 37.00  . Smokeless tobacco: Never Used  . Alcohol use No  . Drug use: No  . Sexual activity: Not on file   Other Topics Concern  . Not on file   Social History Narrative  . No narrative on file    Past Surgical Hx:  Past Surgical History:  Procedure Laterality Date  . CESAREAN SECTION     x2 1996, 1986  . CHOLECYSTECTOMY    . MOHS SURGERY      Past Medical Hx:  Past Medical History:  Diagnosis Date  . Allergy   . Basal cell carcinoma   . Pernicious anemia   . Seasonal allergies     Past Gynecological History:  Abnormal uterine bleeding, c/s x 2. No LMP recorded. Patient is not currently having periods (Reason: IUD).  Family Hx:  Family History  Problem Relation Age of Onset  . Cancer Paternal Aunt     Review of Systems:  Constitutional  Feels well,    ENT Normal appearing ears and nares bilaterally Skin/Breast  No rash, sores, jaundice, itching, dryness Cardiovascular  No chest pain, shortness of breath, or edema  Pulmonary  No cough or wheeze.  Gastro Intestinal  No nausea, vomitting, or diarrhoea. No bright red blood per rectum, no abdominal pain, change in bowel movement, or constipation.  Genito Urinary  No frequency, urgency, dysuria, + abnormal bleeding. Musculo  Skeletal  No myalgia, arthralgia, joint swelling or pain  Neurologic  No weakness, numbness, change in gait,  Psychology  No depression, anxiety, insomnia.   Vitals:  Blood pressure 125/80, pulse 72, temperature 97.6 F (36.4 C), temperature source Oral, resp. rate 18, height 5\' 4"  (1.626 m), weight 202 lb 4.8 oz (91.8 kg), SpO2 98 %.  Physical Exam: WD in NAD Neck  Supple NROM, without any enlargements.  Lymph Node Survey No cervical supraclavicular or inguinal adenopathy Cardiovascular  Pulse normal rate, regularity and rhythm. S1 and S2 normal.  Lungs  Clear to auscultation bilateraly, without wheezes/crackles/rhonchi. Good air movement.  Skin  No rash/lesions/breakdown  Psychiatry  Alert and oriented to person, place, and time  Abdomen  Normoactive bowel sounds, abdomen soft, non-tender and overweight without evidence of hernia.  Back No CVA tenderness Genito Urinary  Vulva/vagina: Normal external female genitalia.  No lesions. No discharge or bleeding.  Bladder/urethra:  No lesions or masses, well supported bladder  Vagina: normal  Cervix: Normal appearing, no lesions.  Uterus:  Small, mobile, no parametrial  involvement or nodularity.  Adnexa: no palpable masses. Rectal  deferred Extremities  No bilateral cyanosis, clubbing or edema.   Donaciano Eva, MD  01/17/2017, 9:51 AM

## 2017-01-25 NOTE — Telephone Encounter (Signed)
Scheduled the patient for a post op appt and the patient will receive the appt on her discharge summary.

## 2017-01-25 NOTE — Anesthesia Procedure Notes (Signed)
Procedure Name: Intubation Date/Time: 01/25/2017 2:42 PM Performed by: Glory Buff Pre-anesthesia Checklist: Patient identified, Emergency Drugs available, Suction available and Patient being monitored Patient Re-evaluated:Patient Re-evaluated prior to induction Oxygen Delivery Method: Circle system utilized Preoxygenation: Pre-oxygenation with 100% oxygen Induction Type: IV induction Ventilation: Mask ventilation without difficulty Laryngoscope Size: Miller and 2 Grade View: Grade I Tube type: Oral Tube size: 7.0 mm Number of attempts: 1 Airway Equipment and Method: Stylet Placement Confirmation: ETT inserted through vocal cords under direct vision,  positive ETCO2 and breath sounds checked- equal and bilateral Secured at: 21 cm Tube secured with: Tape Dental Injury: Teeth and Oropharynx as per pre-operative assessment

## 2017-01-26 ENCOUNTER — Encounter (HOSPITAL_COMMUNITY): Payer: Self-pay | Admitting: Gynecologic Oncology

## 2017-01-26 DIAGNOSIS — C541 Malignant neoplasm of endometrium: Secondary | ICD-10-CM | POA: Diagnosis not present

## 2017-01-26 LAB — BASIC METABOLIC PANEL
Anion gap: 7 (ref 5–15)
BUN: 10 mg/dL (ref 6–20)
CHLORIDE: 106 mmol/L (ref 101–111)
CO2: 22 mmol/L (ref 22–32)
Calcium: 8.5 mg/dL — ABNORMAL LOW (ref 8.9–10.3)
Creatinine, Ser: 0.6 mg/dL (ref 0.44–1.00)
GFR calc non Af Amer: >60 mL/min (ref >60)
Glucose, Bld: 146 mg/dL — ABNORMAL HIGH (ref 65–99)
POTASSIUM: 4 mmol/L (ref 3.5–5.1)
SODIUM: 135 mmol/L (ref 135–145)

## 2017-01-26 LAB — CBC
HCT: 37.1 % (ref 36.0–46.0)
HEMOGLOBIN: 12.6 g/dL (ref 12.0–15.0)
MCH: 32.9 pg (ref 26.0–34.0)
MCHC: 34 g/dL (ref 30.0–36.0)
MCV: 96.9 fL (ref 78.0–100.0)
Platelets: 237 10*3/uL (ref 150–400)
RBC: 3.83 MIL/uL — AB (ref 3.87–5.11)
RDW: 13.4 % (ref 11.5–15.5)
WBC: 6.2 10*3/uL (ref 4.0–10.5)

## 2017-01-26 MED ORDER — OXYCODONE-ACETAMINOPHEN 5-325 MG PO TABS: 1.0000 | ORAL_TABLET | ORAL | 0 refills | Status: DC | PRN

## 2017-01-26 NOTE — Progress Notes (Signed)
Pt walking, tolerating food, voiding and desires discharge.Discharge instructions discussed with patient until no further questions ask. Pt able to verbalize when to call MD and follow up appointment. IV discontinued.

## 2017-01-26 NOTE — Discharge Summary (Signed)
Physician Discharge Summary  Patient ID: Renee Anderson MRN: [id number] DOB/AGE: 54/29/1962 54 y.o.  Admit date: 01/25/2017 Discharge date: 01/26/2017  Admission Diagnoses: Endometrial cancer [facility])  Discharge Diagnoses:  Principal Problem:   Endometrial cancer [facility])   Discharged Condition:  The patient is in good condition and stable for discharge.    Hospital Course: On 01/25/2017, the patient underwent the following: Procedure(s): ROBOTIC ASSISTED TOTAL HYSTERECTOMY WITH BILATERAL SALPINGO OOPHORECTOMY SENTINEL NODE BIOPSY.  The postoperative course was uneventful.  She was discharged to home on postoperative day 1 tolerating a regular diet, ambulating, voiding, pain controlled.  Consults: None  Significant Diagnostic Studies: None  Treatments: surgery: see above  Discharge Exam: Blood pressure 111/77, pulse (!) 55, temperature 98.4 F (36.9 C), temperature source Oral, resp. rate 16, height 5\' 3"  (1.6 m), weight 204 lb (92.5 kg), SpO2 96 %. General appearance: alert, cooperative and no distress Resp: clear to auscultation bilaterally Cardio: regular rate and rhythm, S1, S2 normal, no murmur, click, rub or gallop GI: soft, non-tender; bowel sounds normal; no masses,  no organomegaly Extremities: extremities normal, atraumatic, no cyanosis or edema Incision/Wound: Lap sites to the abdomen with dermabond without erythema or drainage  Disposition: Home  Discharge Instructions    Call MD for:  difficulty breathing, headache or visual disturbances    Complete by:  As directed    Call MD for:  extreme fatigue    Complete by:  As directed    Call MD for:  hives    Complete by:  As directed    Call MD for:  persistant dizziness or light-headedness    Complete by:  As directed    Call MD for:  persistant nausea and vomiting    Complete by:  As directed    Call MD for:  redness, tenderness, or signs of infection (pain, swelling, redness, odor or green/yellow discharge around  incision site)    Complete by:  As directed    Call MD for:  severe uncontrolled pain    Complete by:  As directed    Call MD for:  temperature >100.4    Complete by:  As directed    Diet - low sodium heart healthy    Complete by:  As directed    Driving Restrictions    Complete by:  As directed    No driving for 1 week.  Do not take narcotics and drive.   Increase activity slowly    Complete by:  As directed    Lifting restrictions    Complete by:  As directed    No lifting greater than 10 lbs.   Sexual Activity Restrictions    Complete by:  As directed    No sexual activity, nothing in the vagina, for 8 weeks.     Allergies as of 01/26/2017      Reactions   Bee Venom Anaphylaxis, Hives      Medication List    TAKE these medications   cetirizine 10 MG tablet Commonly known as:  ZYRTEC Take 1 tablet (10 mg total) by mouth daily. What changed:  when to take this  reasons to take this   cyanocobalamin 1000 MCG/ML injection Commonly known as:  (VITAMIN B-12) Inject 1 mL (1,000 mcg total) into the muscle every 14 (fourteen) days.   fluticasone 50 MCG/ACT nasal spray Commonly known as:  FLONASE Place 2 sprays into both nostrils daily. What changed:  when to take this  reasons to take this   furosemide  20 MG tablet Commonly known as:  LASIX Take 20 mg by mouth daily as needed for edema.   Guaifenesin 1200 MG Tb12 Commonly known as:  MUCINEX MAXIMUM STRENGTH Take 1 tablet (1,200 mg total) by mouth every 12 (twelve) hours as needed.   ibuprofen 200 MG tablet Commonly known as:  ADVIL,MOTRIN Take 200 mg by mouth every 6 (six) hours as needed for mild pain.   ipratropium 0.03 % nasal spray Commonly known as:  ATROVENT Place 2 sprays into both nostrils 2 (two) times daily. What changed:  when to take this  reasons to take this   oxyCODONE-acetaminophen 5-325 MG tablet Commonly known as:  PERCOCET/ROXICET Take 1-2 tablets by mouth every 4 (four) hours as  needed for severe pain.   prenatal vitamin w/FE, FA 27-1 MG Tabs tablet TAKE ONE TABLET BY MOUTH EVERY DAY   terbinafine 250 MG tablet Commonly known as:  LAMISIL Take 250 mg by mouth every other day.            Discharge Care Instructions        Start     Ordered   01/26/17 0000  oxyCODONE-acetaminophen (PERCOCET/ROXICET) 5-325 MG tablet  Every 4 hours PRN     01/26/17 0723   01/26/17 0000  Increase activity slowly     01/26/17 1028   01/26/17 0000  Driving Restrictions    Comments:  No driving for 1 week.  Do not take narcotics and drive.   01/26/17 1028   01/26/17 0000  Lifting restrictions    Comments:  No lifting greater than 10 lbs.   01/26/17 1028   01/26/17 0000  Sexual Activity Restrictions    Comments:  No sexual activity, nothing in the vagina, for 8 weeks.   01/26/17 1028   01/26/17 0000  Diet - low sodium heart healthy     01/26/17 1028   01/26/17 0000  Call MD for:  temperature >100.4     01/26/17 1028   01/26/17 0000  Call MD for:  persistant nausea and vomiting     01/26/17 1028   01/26/17 0000  Call MD for:  severe uncontrolled pain     01/26/17 1028   01/26/17 0000  Call MD for:  redness, tenderness, or signs of infection (pain, swelling, redness, odor or green/yellow discharge around incision site)     01/26/17 1028   01/26/17 0000  Call MD for:  difficulty breathing, headache or visual disturbances     01/26/17 1028   01/26/17 0000  Call MD for:  hives     01/26/17 1028   01/26/17 0000  Call MD for:  persistant dizziness or light-headedness     01/26/17 1028   01/26/17 0000  Call MD for:  extreme fatigue     01/26/17 1028       Greater than thirty minutes were spend for face to face discharge instructions and discharge orders/summary in EPIC.   Signed: Tristen Luce DEAL 01/26/2017, 10:31 AM

## 2017-01-26 NOTE — Discharge Instructions (Signed)
01/25/2017  Return to work: 4 weeks  Activity: 1. Be up and out of the bed during the day.  Take a nap if needed.  You may walk up steps but be careful and use the hand rail.  Stair climbing will tire you more than you think, you may need to stop part way and rest.   2. No lifting or straining for 6 weeks.  3. No driving for 1 weeks.  Do Not drive if you are taking narcotic pain medicine.  4. Shower daily.  Use soap and water on your incision and pat dry; don't rub.   5. No sexual activity and nothing in the vagina for 8 weeks.  Medications:  - Take ibuprofen and tylenol first line for pain control. Take these regularly (every 6 hours) to decrease the build up of pain.  - If necessary, for severe pain not relieved by ibuprofen, take percocet.  - While taking percocet you should take sennakot every night to reduce the likelihood of constipation. If this causes diarrhea, stop its use.  Diet: 1. Low sodium Heart Healthy Diet is recommended.  2. It is safe to use a laxative if you have difficulty moving your bowels.   Wound Care: 1. Keep clean and dry.  Shower daily.  Reasons to call the Doctor:   Fever - Oral temperature greater than 100.4 degrees Fahrenheit  Foul-smelling vaginal discharge  Difficulty urinating  Nausea and vomiting  Increased pain at the site of the incision that is unrelieved with pain medicine.  Difficulty breathing with or without chest pain  New calf pain especially if only on one side  Sudden, continuing increased vaginal bleeding with or without clots.   Follow-up: 1. See Everitt Amber in 4 weeks.  Contacts: For questions or concerns you should contact:  Dr. Everitt Amber at [phone] After hours and on week-ends call [phone] and ask to speak to the physician on call for Gynecologic Oncology  Acetaminophen; Oxycodone tablets What is this medicine? ACETAMINOPHEN; OXYCODONE (a set a MEE noe fen; ox i KOE done) is a pain reliever. It is  used to treat moderate to severe pain. This medicine may be used for other purposes; ask your health care provider or pharmacist if you have questions. COMMON BRAND NAME(S): Endocet, Magnacet, Narvox, Percocet, Perloxx, Primalev, Primlev, Roxicet, Xolox What should I tell my health care provider before I take this medicine? They need to know if you have any of these conditions: -brain tumor -Crohn's disease, inflammatory bowel disease, or ulcerative colitis -drug abuse or addiction -head injury -heart or circulation problems -if you often drink alcohol -kidney disease or problems going to the bathroom -liver disease -lung disease, asthma, or breathing problems -an unusual or allergic reaction to acetaminophen, oxycodone, other opioid analgesics, other medicines, foods, dyes, or preservatives -pregnant or trying to get pregnant -breast-feeding How should I use this medicine? Take this medicine by mouth with a full glass of water. Follow the directions on the prescription label. You can take it with or without food. If it upsets your stomach, take it with food. Take your medicine at regular intervals. Do not take it more often than directed. A special MedGuide will be given to you by the pharmacist with each prescription and refill. Be sure to read this information carefully each time. Talk to your pediatrician regarding the use of this medicine in children. Special care may be needed. Overdosage: If you think you have taken too much of this medicine contact  a poison control center or emergency room at once. NOTE: This medicine is only for you. Do not share this medicine with others. What if I miss a dose? If you miss a dose, take it as soon as you can. If it is almost time for your next dose, take only that dose. Do not take double or extra doses. What may interact with this medicine? This medicine may interact with the following medications: -alcohol -antihistamines for allergy, cough and  cold -antiviral medicines for HIV or AIDS -atropine -certain antibiotics like clarithromycin, erythromycin, linezolid, rifampin -certain medicines for anxiety or sleep -certain medicines for bladder problems like oxybutynin, tolterodine -certain medicines for depression like amitriptyline, fluoxetine, sertraline -certain medicines for fungal infections like ketoconazole, itraconazole, voriconazole -certain medicines for migraine headache like almotriptan, eletriptan, frovatriptan, naratriptan, rizatriptan, sumatriptan, zolmitriptan -certain medicines for nausea or vomiting like dolasetron, ondansetron, palonosetron -certain medicines for Parkinson's disease like benztropine, trihexyphenidyl -certain medicines for seizures like phenobarbital, phenytoin, primidone -certain medicines for stomach problems like dicyclomine, hyoscyamine -certain medicines for travel sickness like scopolamine -diuretics -general anesthetics like halothane, isoflurane, methoxyflurane, propofol -ipratropium -local anesthetics like lidocaine, pramoxine, tetracaine -MAOIs like Carbex, Eldepryl, Marplan, Nardil, and Parnate -medicines that relax muscles for surgery -methylene blue -nilotinib -other medicines with acetaminophen -other narcotic medicines for pain or cough -phenothiazines like chlorpromazine, mesoridazine, prochlorperazine, thioridazine This list may not describe all possible interactions. Give your health care provider a list of all the medicines, herbs, non-prescription drugs, or dietary supplements you use. Also tell them if you smoke, drink alcohol, or use illegal drugs. Some items may interact with your medicine. What should I watch for while using this medicine? Tell your doctor or health care professional if your pain does not go away, if it gets worse, or if you have new or a different type of pain. You may develop tolerance to the medicine. Tolerance means that you will need a higher dose of the  medication for pain relief. Tolerance is normal and is expected if you take this medicine for a long time. Do not suddenly stop taking your medicine because you may develop a severe reaction. Your body becomes used to the medicine. This does NOT mean you are addicted. Addiction is a behavior related to getting and using a drug for a non-medical reason. If you have pain, you have a medical reason to take pain medicine. Your doctor will tell you how much medicine to take. If your doctor wants you to stop the medicine, the dose will be slowly lowered over time to avoid any side effects. There are different types of narcotic medicines (opiates). If you take more than one type at the same time or if you are taking another medicine that also causes drowsiness, you may have more side effects. Give your health care provider a list of all medicines you use. Your doctor will tell you how much medicine to take. Do not take more medicine than directed. Call emergency for help if you have problems breathing or unusual sleepiness. Do not take other medicines that contain acetaminophen with this medicine. Always read labels carefully. If you have questions, ask your doctor or pharmacist. If you take too much acetaminophen get medical help right away. Too much acetaminophen can be very dangerous and cause liver damage. Even if you do not have symptoms, it is important to get help right away. You may get drowsy or dizzy. Do not drive, use machinery, or do anything that needs mental alertness until you know  how this medicine affects you. Do not stand or sit up quickly, especially if you are an older patient. This reduces the risk of dizzy or fainting spells. Alcohol may interfere with the effect of this medicine. Avoid alcoholic drinks. The medicine will cause constipation. Try to have a bowel movement at least every 2 to 3 days. If you do not have a bowel movement for 3 days, call your doctor or health care professional. Your  mouth may get dry. Chewing sugarless gum or sucking hard candy, and drinking plenty or water may help. Contact your doctor if the problem does not go away or is severe. What side effects may I notice from receiving this medicine? Side effects that you should report to your doctor or health care professional as soon as possible: -allergic reactions like skin rash, itching or hives, swelling of the face, lips, or tongue -breathing problems -confusion -redness, blistering, peeling or loosening of the skin, including inside the mouth -signs and symptoms of liver injury like dark yellow or brown urine; general ill feeling or flu-like symptoms; light-colored stools; loss of appetite; nausea; right upper belly pain; unusually weak or tired; yellowing of the eyes or skin -signs and symptoms of low blood pressure like dizziness; feeling faint or lightheaded, falls; unusually weak or tired -trouble passing urine or change in the amount of urine Side effects that usually do not require medical attention (report to your doctor or health care professional if they continue or are bothersome): -constipation -dry mouth -nausea, vomiting -tiredness This list may not describe all possible side effects. Call your doctor for medical advice about side effects. You may report side effects to FDA at 1-800-FDA-1088. Where should I keep my medicine? Keep out of the reach of children. This medicine can be abused. Keep your medicine in a safe place to protect it from theft. Do not share this medicine with anyone. Selling or giving away this medicine is dangerous and against the law. This medicine may cause accidental overdose and death if it taken by other adults, children, or pets. Mix any unused medicine with a substance like cat litter or coffee grounds. Then throw the medicine away in a sealed container like a sealed bag or a coffee can with a lid. Do not use the medicine after the expiration date. Store at room  temperature between 20 and 25 degrees C (68 and 77 degrees F). NOTE: This sheet is a summary. It may not cover all possible information. If you have questions about this medicine, talk to your doctor, pharmacist, or health care provider.  2018 [employer]vier/Gold Standard (2015-02-15 21:48:12)

## 2017-02-01 ENCOUNTER — Telehealth: Payer: Self-pay | Admitting: *Deleted

## 2017-02-06 ENCOUNTER — Telehealth: Payer: Self-pay | Admitting: Gynecologic Oncology

## 2017-02-06 ENCOUNTER — Other Ambulatory Visit (HOSPITAL_BASED_OUTPATIENT_CLINIC_OR_DEPARTMENT_OTHER): Payer: BLUE CROSS/BLUE SHIELD

## 2017-02-06 DIAGNOSIS — R300 Dysuria: Secondary | ICD-10-CM

## 2017-02-06 LAB — URINALYSIS, MICROSCOPIC - CHCC
BLOOD: NEGATIVE
Bilirubin (Urine): NEGATIVE
Glucose: NEGATIVE mg/dL
Ketones: NEGATIVE mg/dL
NITRITE: NEGATIVE
PH: 5 (ref 4.6–8.0)
PROTEIN: NEGATIVE mg/dL
Specific Gravity, Urine: 1.025 (ref 1.003–1.035)
UROBILINOGEN UR: 0.2 mg/dL (ref 0.2–1)

## 2017-02-06 NOTE — Telephone Encounter (Signed)
Returned call to patient.  She reports dysuria symptoms over the weekend.  Asking about path report.  Lab appt for today made to assess urine for infection.  Advised she would be notified of the results.

## 2017-02-07 ENCOUNTER — Telehealth: Payer: Self-pay | Admitting: Gynecologic Oncology

## 2017-02-07 LAB — URINE CULTURE

## 2017-02-07 NOTE — Telephone Encounter (Signed)
Patient informed of path results and Dr. Serita Grit recommendations for no further treatment.  All questions answered.  Advised to call for any needs or concerns.

## 2017-02-09 NOTE — Telephone Encounter (Signed)
Open by mistake

## 2017-02-14 ENCOUNTER — Telehealth: Payer: Self-pay | Admitting: *Deleted

## 2017-02-15 NOTE — Telephone Encounter (Signed)
Open by mistake

## 2017-02-21 ENCOUNTER — Telehealth: Payer: Self-pay | Admitting: Gynecologic Oncology

## 2017-02-21 NOTE — Telephone Encounter (Signed)
Left message for patient advising her that her FMLA paperwork was ready to be picked up.  Advised to call the office.

## 2017-02-22 NOTE — Telephone Encounter (Signed)
Spoke with Ms Timm and she wants this office to fax the STD form to [employer].  She will pick up the original papers at visit on 03-05-17 with Dr. Denman George. Faxed form to [employer] and have a copy in patient's gyn chart . For pick up on 03-05-17 as requested by patient.

## 2017-03-05 ENCOUNTER — Encounter: Payer: Self-pay | Admitting: Gynecologic Oncology

## 2017-03-05 ENCOUNTER — Ambulatory Visit: Payer: BLUE CROSS/BLUE SHIELD | Attending: Gynecologic Oncology | Admitting: Gynecologic Oncology

## 2017-03-05 VITALS — BP 105/85 | HR 74 | Temp 98.6°F | Resp 20 | Ht 63.0 in | Wt 197.0 lb

## 2017-03-05 DIAGNOSIS — Z7189 Other specified counseling: Secondary | ICD-10-CM

## 2017-03-05 DIAGNOSIS — F17210 Nicotine dependence, cigarettes, uncomplicated: Secondary | ICD-10-CM | POA: Insufficient documentation

## 2017-03-05 DIAGNOSIS — T8132XA Disruption of internal operation (surgical) wound, not elsewhere classified, initial encounter: Secondary | ICD-10-CM | POA: Diagnosis not present

## 2017-03-05 DIAGNOSIS — Z85828 Personal history of other malignant neoplasm of skin: Secondary | ICD-10-CM | POA: Diagnosis not present

## 2017-03-05 DIAGNOSIS — Z801 Family history of malignant neoplasm of trachea, bronchus and lung: Secondary | ICD-10-CM | POA: Diagnosis not present

## 2017-03-05 DIAGNOSIS — C541 Malignant neoplasm of endometrium: Secondary | ICD-10-CM | POA: Diagnosis not present

## 2017-03-05 DIAGNOSIS — Z90710 Acquired absence of both cervix and uterus: Secondary | ICD-10-CM | POA: Insufficient documentation

## 2017-03-05 DIAGNOSIS — Z90722 Acquired absence of ovaries, bilateral: Secondary | ICD-10-CM | POA: Diagnosis not present

## 2017-03-05 DIAGNOSIS — Z9079 Acquired absence of other genital organ(s): Secondary | ICD-10-CM | POA: Diagnosis not present

## 2017-03-05 DIAGNOSIS — D510 Vitamin B12 deficiency anemia due to intrinsic factor deficiency: Secondary | ICD-10-CM | POA: Insufficient documentation

## 2017-03-05 MED ORDER — ESTROGENS, CONJUGATED 0.625 MG/GM VA CREA: 1.0000 | TOPICAL_CREAM | Freq: Every day | VAGINAL | 12 refills | Status: AC

## 2017-03-05 NOTE — Progress Notes (Signed)
[facility] ordered on final path with [facility].

## 2017-03-05 NOTE — Patient Instructions (Signed)
Please follow-up with Dr Denman George in April 2019.  Please call Dr Serita Grit office in January, 2019 to schedule this [phone]).  Contact her office for new bleeding, pains or lower extremity edema.  Take vaginal premarin cream nightly for 1 month.

## 2017-03-05 NOTE — Progress Notes (Signed)
Consult Note: Gyn-Onc  Consult was requested by Dr. Harrington Challenger for the evaluation of Renee Anderson 56 y.o. female  CC:  Chief Complaint  Patient presents with  . Endometrial cancer [facility])    Assessment/Plan:  Ms. ALANNA STORTI  is a 56 y.o.  year old with stage IA grade 1 endometrial cancer.  Pathology revealed low risk factors for recurrence, therefore no adjuvant therapy is recommended according to NCCN guidelines.  I discussed risk for recurrence and typical symptoms encouraged her to notify us of these should they develop between visits.  I recommend she have follow-up every 6 months for 5 years in accordance with NCCN guidelines. Those visits should include symptom assessment, physical exam and pelvic examination. Pap smears are not indicated or recommended in the routine surveillance of endometrial cancer.  Needs MSI testing on tumor and genetics referral if instability identified.  Vaginal cuff mucosal separation - recommend daily premarin at night x 1 month.   HPI: Renee Anderson is a 56 year old G2P2 who is seen in consultation at the request for Dr Vanessa Kick for grade 1 endometrial cancer. The patient has a history of abnormal uterine bleeding (remote) that has been well controlled with a Mirena IUD for many years (most recent one placed February, 2014). She began developing new vaginal spotting in May 2018 and was seen by Dr Harrington Challenger who performed an [place] on 12/25/16. This revealed a uterus measuring 5.4x4x4cm and a thickened stripe of 4cm. The adnexa were normal.  An office endometrial biopsy was performed which revealed grade 1 endometrioid endometrial cancer.  The patient's only family history is a paternal aunt with lung cancer (smoker). Her only prior surgeries were c/s x 2 and open cholecystectomy. She is otherwise very healthy.   Interval Hx:  On 01/25/17 she underwent robotic assisted total hysterectomy, BSO, and SLN biopsy. Final pathology revealed a 3cm grade 1  endometrioid adenocarcinoma with 0.5 of 1.7cm myometrial invasion, no LVSI and negative cervix, adnexae and nodes.  She was staged as stage IA grade 1 endometrial cancer with a recommendation for close surveillance in accordance with NCCN guidelines.  Since surgery she has done well. She has some daily spotting.  Current Meds:  Outpatient Encounter Prescriptions as of 03/05/2017  Medication Sig  . cetirizine (ZYRTEC) 10 MG tablet Take 1 tablet (10 mg total) by mouth daily. (Patient taking differently: Take 10 mg by mouth daily as needed for allergies. )  . cyanocobalamin (,VITAMIN B-12,) 1000 MCG/ML injection Inject 1 mL (1,000 mcg total) into the muscle every 14 (fourteen) days.  . fluticasone (FLONASE) 50 MCG/ACT nasal spray Place 2 sprays into both nostrils daily. (Patient taking differently: Place 2 sprays into both nostrils daily as needed for allergies. )  . furosemide (LASIX) 20 MG tablet Take 20 mg by mouth daily as needed for edema.   . Guaifenesin (MUCINEX MAXIMUM STRENGTH) 1200 MG TB12 Take 1 tablet (1,200 mg total) by mouth every 12 (twelve) hours as needed.  Marland Kitchen ibuprofen (ADVIL,MOTRIN) 200 MG tablet Take 200 mg by mouth every 6 (six) hours as needed for mild pain.  Marland Kitchen ipratropium (ATROVENT) 0.03 % nasal spray Place 2 sprays into both nostrils 2 (two) times daily. (Patient taking differently: Place 2 sprays into both nostrils 2 (two) times daily as needed for rhinitis. )  . oxyCODONE-acetaminophen (PERCOCET/ROXICET) 5-325 MG tablet Take 1-2 tablets by mouth every 4 (four) hours as needed for severe pain.  . prenatal vitamin w/FE, FA (PRENATAL 1 + 1)  27-1 MG TABS TAKE ONE TABLET BY MOUTH EVERY DAY  . terbinafine (LAMISIL) 250 MG tablet Take 250 mg by mouth every other day.   No facility-administered encounter medications on file as of 03/05/2017.     Allergy:  Allergies  Allergen Reactions  . Bee Venom Anaphylaxis and Hives    Social Hx:   Social History   Social History  .  Marital status: Divorced    Spouse name: N/A  . Number of children: N/A  . Years of education: N/A   Occupational History  . Not on file.   Social History Main Topics  . Smoking status: Current Every Day Smoker    Packs/day: 0.50    Years: 37.00  . Smokeless tobacco: Never Used  . Alcohol use No  . Drug use: No  . Sexual activity: Not on file   Other Topics Concern  . Not on file   Social History Narrative  . No narrative on file    Past Surgical Hx:  Past Surgical History:  Procedure Laterality Date  . CESAREAN SECTION     x2 1996, 1986  . CHOLECYSTECTOMY    . MOHS SURGERY    . ROBOTIC ASSISTED TOTAL HYSTERECTOMY WITH BILATERAL SALPINGO OOPHERECTOMY Bilateral 01/25/2017   Procedure: ROBOTIC ASSISTED TOTAL HYSTERECTOMY WITH BILATERAL SALPINGO OOPHORECTOMY;  Surgeon: Everitt Amber, MD;  Location: [facility];  Service: Gynecology;  Laterality: Bilateral;  . SENTINEL NODE BIOPSY Bilateral 01/25/2017   Procedure: SENTINEL NODE BIOPSY;  Surgeon: Everitt Amber, MD;  Location: [facility];  Service: Gynecology;  Laterality: Bilateral;    Past Medical Hx:  Past Medical History:  Diagnosis Date  . Allergy   . Basal cell carcinoma   . Pernicious anemia   . Seasonal allergies     Past Gynecological History:  Abnormal uterine bleeding, c/s x 2. No LMP recorded. Patient is not currently having periods (Reason: IUD).  Family Hx:  Family History  Problem Relation Age of Onset  . Cancer Paternal Aunt     Review of Systems:  Constitutional  Feels well,    ENT Normal appearing ears and nares bilaterally Skin/Breast  No rash, sores, jaundice, itching, dryness Cardiovascular  No chest pain, shortness of breath, or edema  Pulmonary  No cough or wheeze.  Gastro Intestinal  No nausea, vomitting, or diarrhoea. No bright red blood per rectum, no abdominal pain, change in bowel movement, or constipation.  Genito Urinary  No frequency, urgency, dysuria, + abnormal bleeding. Musculo  Skeletal  No myalgia, arthralgia, joint swelling or pain  Neurologic  No weakness, numbness, change in gait,  Psychology  No depression, anxiety, insomnia.   Vitals:  Blood pressure 105/85, pulse 74, temperature 98.6 F (37 C), temperature source Oral, resp. rate 20, height _0  (1.6 m), weight 197 lb (89.4 kg), SpO2 99 %.  Physical Exam: WD in NAD Neck  Supple NROM, without any enlargements.  Lymph Node Survey No cervical supraclavicular or inguinal adenopathy Cardiovascular  Pulse normal rate, regularity and rhythm. S1 and S2 normal.  Lungs  Clear to auscultation bilateraly, without wheezes/crackles/rhonchi. Good air movement.  Skin  No rash/lesions/breakdown  Psychiatry  Alert and oriented to person, place, and time  Abdomen  Normoactive bowel sounds, abdomen soft, non-tender and overweight without evidence of hernia. Incisions are well healed. Back No CVA tenderness Genito Urinary  Vulva/vagina: vaginal cuff with <1cm gap between middle sutures. No evisceration or gross dehiscence. No masses or lesions. Rectal  deferred Extremities  No bilateral cyanosis,  clubbing or edema.   30 minutes of direct face to face counseling time was spent with the patient. This included discussion about prognosis, therapy recommendations and postoperative side effects and are beyond the scope of routine postoperative care.    Donaciano Eva, MD  03/05/2017, 3:58 PM

## 2017-06-06 ENCOUNTER — Other Ambulatory Visit: Payer: Self-pay | Admitting: Obstetrics and Gynecology

## 2017-06-06 DIAGNOSIS — Z139 Encounter for screening, unspecified: Secondary | ICD-10-CM

## 2017-06-11 ENCOUNTER — Ambulatory Visit: Payer: BLUE CROSS/BLUE SHIELD

## 2017-08-07 ENCOUNTER — Ambulatory Visit
Admission: RE | Admit: 2017-08-07 | Discharge: 2017-08-07 | Disposition: A | Discharge status: Home / Self Care | Payer: BLUE CROSS/BLUE SHIELD | Source: Ambulatory Visit | Attending: Obstetrics and Gynecology | Admitting: Obstetrics and Gynecology

## 2017-08-07 DIAGNOSIS — Z139 Encounter for screening, unspecified: Secondary | ICD-10-CM

## 2017-08-22 ENCOUNTER — Telehealth: Payer: Self-pay | Admitting: *Deleted

## 2017-08-22 NOTE — Telephone Encounter (Signed)
Returned the patient's call and scheduled an appt for April 23rd at 3pm

## 2017-09-12 ENCOUNTER — Encounter: Payer: Self-pay | Admitting: Physician Assistant

## 2017-09-25 ENCOUNTER — Inpatient Hospital Stay: Payer: BLUE CROSS/BLUE SHIELD | Attending: Gynecologic Oncology | Admitting: Gynecologic Oncology

## 2017-09-25 ENCOUNTER — Encounter: Payer: Self-pay | Admitting: Gynecologic Oncology

## 2017-09-25 VITALS — BP 99/58 | HR 74 | Temp 98.2°F | Ht 63.0 in | Wt 172.3 lb

## 2017-09-25 DIAGNOSIS — Z90710 Acquired absence of both cervix and uterus: Secondary | ICD-10-CM | POA: Insufficient documentation

## 2017-09-25 DIAGNOSIS — F17210 Nicotine dependence, cigarettes, uncomplicated: Secondary | ICD-10-CM | POA: Diagnosis not present

## 2017-09-25 DIAGNOSIS — Z90722 Acquired absence of ovaries, bilateral: Secondary | ICD-10-CM | POA: Insufficient documentation

## 2017-09-25 DIAGNOSIS — C541 Malignant neoplasm of endometrium: Secondary | ICD-10-CM

## 2017-09-25 DIAGNOSIS — Z87891 Personal history of nicotine dependence: Secondary | ICD-10-CM | POA: Diagnosis not present

## 2017-09-25 NOTE — Progress Notes (Signed)
Follow-up Note: Gyn-Onc  Consult was requested by Dr. Harrington Challenger for the evaluation of Renee Anderson 57 y.o. female  CC:  Chief Complaint  Patient presents with  . Endometrial cancer [facility])    Assessment/Plan:  Renee Anderson  is a 57 y.o.  year old with stage IA grade 1 endometrial cancer. MSI stable.   Pathology revealed low risk factors for recurrence, therefore no adjuvant therapy was recommended according to NCCN guidelines.  I discussed risk for recurrence and typical symptoms encouraged her to notify us of these should they develop between visits.  I recommend she continue to have follow-up every 6 months for 5 years in accordance with NCCN guidelines. She will alternate these visits with Dr Harrington Challenger and myself. Those visits should include symptom assessment, physical exam and pelvic examination. Pap smears are not indicated or recommended in the routine surveillance of endometrial cancer.  HPI: Renee Anderson is a 57 year old G2P2 who is seen in consultation at the request for Dr Vanessa Kick for grade 1 endometrial cancer. The patient has a history of abnormal uterine bleeding (remote) that has been well controlled with a Mirena IUD for many years (most recent one placed February, 2014). She began developing new vaginal spotting in May 2018 and was seen by Dr Harrington Challenger who performed an [place] on 12/25/16. This revealed a uterus measuring 5.4x4x4cm and a thickened stripe of 4cm. The adnexa were normal.  An office endometrial biopsy was performed which revealed grade 1 endometrioid endometrial cancer.  The patient's only family history is a paternal aunt with lung cancer (smoker). Her only prior surgeries were c/s x 2 and open cholecystectomy. She is otherwise very healthy.   Interval Hx:  On 01/25/17 she underwent robotic assisted total hysterectomy, BSO, and SLN biopsy. Final pathology revealed a 3cm grade 1 endometrioid adenocarcinoma with 0.5 of 1.7cm myometrial invasion, no LVSI and  negative cervix, adnexae and nodes. MSI stable. She was staged as stage IA grade 1 endometrial cancer with a recommendation for close surveillance in accordance with NCCN guidelines.  Since surgery she has done well. She has no bleeding or complaints concerning for recurrence.   Current Meds:  Outpatient Encounter Medications as of 09/25/2017  Medication Sig  . cetirizine (ZYRTEC) 10 MG tablet Take 1 tablet (10 mg total) by mouth daily. (Patient taking differently: Take 10 mg by mouth daily as needed for allergies. )  . cyanocobalamin (,VITAMIN B-12,) 1000 MCG/ML injection Inject 1 mL (1,000 mcg total) into the muscle every 14 (fourteen) days.  . fluticasone (FLONASE) 50 MCG/ACT nasal spray Place 2 sprays into both nostrils daily. (Patient taking differently: Place 2 sprays into both nostrils daily as needed for allergies. )  . furosemide (LASIX) 20 MG tablet Take 20 mg by mouth daily as needed for edema.   . Guaifenesin (MUCINEX MAXIMUM STRENGTH) 1200 MG TB12 Take 1 tablet (1,200 mg total) by mouth every 12 (twelve) hours as needed.  Marland Kitchen ibuprofen (ADVIL,MOTRIN) 200 MG tablet Take 200 mg by mouth every 6 (six) hours as needed for mild pain.  Marland Kitchen ipratropium (ATROVENT) 0.03 % nasal spray Place 2 sprays into both nostrils 2 (two) times daily. (Patient taking differently: Place 2 sprays into both nostrils 2 (two) times daily as needed for rhinitis. )  . prenatal vitamin w/FE, FA (PRENATAL 1 + 1) 27-1 MG TABS TAKE ONE TABLET BY MOUTH EVERY DAY  . terbinafine (LAMISIL) 250 MG tablet Take 250 mg by mouth every other day.  . [  DISCONTINUED] oxyCODONE-acetaminophen (PERCOCET/ROXICET) 5-325 MG tablet Take 1-2 tablets by mouth every 4 (four) hours as needed for severe pain. (Patient not taking: Reported on 09/25/2017)   No facility-administered encounter medications on file as of 09/25/2017.     Allergy:  Allergies  Allergen Reactions  . Bee Venom Anaphylaxis and Hives    Social Hx:   Social History    Socioeconomic History  . Marital status: Divorced    Spouse name: Not on file  . Number of children: Not on file  . Years of education: Not on file  . Highest education level: Not on file  Occupational History  . Not on file  Social Needs  . Financial resource strain: Not on file  . Food insecurity:    Worry: Not on file    Inability: Not on file  . Transportation needs:    Medical: Not on file    Non-medical: Not on file  Tobacco Use  . Smoking status: Current Every Day Smoker    Packs/day: 0.50    Years: 37.00    Pack years: 18.50  . Smokeless tobacco: Never Used  Substance and Sexual Activity  . Alcohol use: No    Alcohol/week: 0.0 oz  . Drug use: No  . Sexual activity: Not on file  Lifestyle  . Physical activity:    Days per week: Not on file    Minutes per session: Not on file  . Stress: Not on file  Relationships  . Social connections:    Talks on phone: Not on file    Gets together: Not on file    Attends religious service: Not on file    Active member of club or organization: Not on file    Attends meetings of clubs or organizations: Not on file    Relationship status: Not on file  . Intimate partner violence:    Fear of current or ex partner: Not on file    Emotionally abused: Not on file    Physically abused: Not on file    Forced sexual activity: Not on file  Other Topics Concern  . Not on file  Social History Narrative  . Not on file    Past Surgical Hx:  Past Surgical History:  Procedure Laterality Date  . CESAREAN SECTION     x2 1996, 1986  . CHOLECYSTECTOMY    . MOHS SURGERY    . ROBOTIC ASSISTED TOTAL HYSTERECTOMY WITH BILATERAL SALPINGO OOPHERECTOMY Bilateral 01/25/2017   Procedure: ROBOTIC ASSISTED TOTAL HYSTERECTOMY WITH BILATERAL SALPINGO OOPHORECTOMY;  Surgeon: Everitt Amber, MD;  Location: [facility];  Service: Gynecology;  Laterality: Bilateral;  . SENTINEL NODE BIOPSY Bilateral 01/25/2017   Procedure: SENTINEL NODE BIOPSY;  Surgeon:  Everitt Amber, MD;  Location: [facility];  Service: Gynecology;  Laterality: Bilateral;    Past Medical Hx:  Past Medical History:  Diagnosis Date  . Allergy   . Basal cell carcinoma    Endometrial  . Pernicious anemia   . Seasonal allergies     Past Gynecological History:  Abnormal uterine bleeding, c/s x 2. No LMP recorded. (Menstrual status: IUD).  Family Hx:  Family History  Problem Relation Age of Onset  . Cancer Paternal Aunt   . Breast cancer Paternal Aunt        ? age of onset    Review of Systems:  Constitutional  Feels well,    ENT Normal appearing ears and nares bilaterally Skin/Breast  No rash, sores, jaundice, itching, dryness Cardiovascular  No  chest pain, shortness of breath, or edema  Pulmonary  No cough or wheeze.  Gastro Intestinal  No nausea, vomitting, or diarrhoea. No bright red blood per rectum, no abdominal pain, change in bowel movement, or constipation.  Genito Urinary  No frequency, urgency, dysuria, + abnormal bleeding. Musculo Skeletal  No myalgia, arthralgia, joint swelling or pain  Neurologic  No weakness, numbness, change in gait,  Psychology  No depression, anxiety, insomnia.   Vitals:  Blood pressure (!) 99/58, pulse 74, temperature 98.2 F (36.8 C), temperature source Oral, height 5' 3"  (1.6 m), weight 172 lb 4.8 oz (78.2 kg), SpO2 100 %.  Physical Exam: WD in NAD Neck  Supple NROM, without any enlargements.  Lymph Node Survey No cervical supraclavicular or inguinal adenopathy Cardiovascular  Pulse normal rate, regularity and rhythm. S1 and S2 normal.  Lungs  Clear to auscultation bilateraly, without wheezes/crackles/rhonchi. Good air movement.  Skin  No rash/lesions/breakdown  Psychiatry  Alert and oriented to person, place, and time  Abdomen  Normoactive bowel sounds, abdomen soft, non-tender and overweight without evidence of hernia. Incisions are well healed, no palpable masses. Back No CVA tenderness Genito Urinary   Vulva/vagina: vaginal cuff in tact with no bleeding or separation.  No masses or lesions visible or palpable.  Rectal  deferred Extremities  No bilateral cyanosis, clubbing or edema.  Thereasa Solo, MD  09/25/2017, 3:37 PM

## 2017-09-25 NOTE — Patient Instructions (Signed)
Please notify Dr Denman George at phone number [phone] if you notice vaginal bleeding, new pelvic or abdominal pains, bloating, feeling full easy, or a change in bladder or bowel function.   Please return to see Dr Denman George in April, 2020. Call her office at the number above to make this appointment in the new year.   Please return to see Dr Harrington Challenger in approximately October, 2019.

## 2017-12-19 ENCOUNTER — Ambulatory Visit (INDEPENDENT_AMBULATORY_CARE_PROVIDER_SITE_OTHER): Payer: Self-pay

## 2017-12-19 ENCOUNTER — Ambulatory Visit (INDEPENDENT_AMBULATORY_CARE_PROVIDER_SITE_OTHER): Payer: BLUE CROSS/BLUE SHIELD | Admitting: Family

## 2017-12-19 ENCOUNTER — Encounter (INDEPENDENT_AMBULATORY_CARE_PROVIDER_SITE_OTHER): Payer: Self-pay | Admitting: Family

## 2017-12-19 VITALS — Ht 63.0 in | Wt 173.0 lb

## 2017-12-19 DIAGNOSIS — S93411A Sprain of calcaneofibular ligament of right ankle, initial encounter: Secondary | ICD-10-CM | POA: Diagnosis not present

## 2017-12-19 DIAGNOSIS — M25571 Pain in right ankle and joints of right foot: Secondary | ICD-10-CM | POA: Diagnosis not present

## 2017-12-19 NOTE — Progress Notes (Signed)
Office Visit Note   Patient: Renee Anderson           Date of Birth: February 27, 1961           MRN: [id number] Visit Date: 12/19/2017              Requested by: Hayden Rasmussen, MD [address] [place], [place] [address] PCP: Hayden Rasmussen, MD  Chief Complaint  Patient presents with  . Right Ankle - Pain      HPI: The patient is a 56 year old woman seen today for evaluation of right lateral ankle pain. Has been ongoing for about 2 weeks. Was pushing significant other in wheel chair through the yard and stepped in a hole. Twisted ankle. Pain does not seem to been improving. Swells over course of day. No ecchymosis. Able to weight bear. Has ankle sleeve that is not helping.   Assessment & Plan: Visit Diagnoses:  1. Sprain of calcaneofibular ligament of right ankle, initial encounter   2. Pain in right ankle and joints of right foot     Plan: given ASO. Wear for next 2 weeks with ambulation. Advance out of aso as tolerated. May use ice and elevation for swelling. To use IBU or aleve for pain.   Follow-Up Instructions: Return in about 3 weeks (around 01/09/2018), or if symptoms worsen or fail to improve.   Right Ankle Exam   Tenderness  The patient is experiencing tenderness in the ATF and CF. Swelling: mild  Range of Motion  The patient has normal right ankle ROM.  Tests  Anterior drawer: negative  Other  Erythema: absent Sensation: normal       Patient is alert, oriented, no adenopathy, well-dressed, normal affect, normal respiratory effort.   Imaging: Xr Ankle Complete Right  Result Date: 12/19/2017 Radiographs of right ankle are negative for fracture. Mortise is congruent.  No images are attached to the encounter.  Labs: Lab Results  Component Value Date   LABORGA NO GROWTH 10/10/2011     Lab Results  Component Value Date   ALBUMIN 3.7 01/19/2017    Body mass index is 30.65 kg/m.  Orders:  Orders Placed This Encounter    Procedures  . XR Ankle Complete Right   No orders of the defined types were placed in this encounter.    Procedures: No procedures performed  Clinical Data: No additional findings.  ROS:  All other systems negative, except as noted in the HPI. Review of Systems  Constitutional: Negative for chills and fever.  Musculoskeletal: Positive for arthralgias, joint swelling and myalgias.    Objective: Vital Signs: Ht 5\' 3"  (1.6 m)   Wt 173 lb (78.5 kg)   BMI 30.65 kg/m   Specialty Comments:  No specialty comments available.  PMFS History: Patient Active Problem List   Diagnosis Date Noted  . Endometrial cancer ([place]) 01/17/2017  . Pernicious anemia   . Basal cell carcinoma   . Seasonal allergies    Past Medical History:  Diagnosis Date  . Allergy   . Basal cell carcinoma    Endometrial  . Pernicious anemia   . Seasonal allergies     Family History  Problem Relation Age of Onset  . Cancer Paternal Aunt   . Breast cancer Paternal Aunt        ? age of onset    Past Surgical History:  Procedure Laterality Date  . CESAREAN SECTION     x2 1996, 1986  .  CHOLECYSTECTOMY    . MOHS SURGERY    . ROBOTIC ASSISTED TOTAL HYSTERECTOMY WITH BILATERAL SALPINGO OOPHERECTOMY Bilateral 01/25/2017   Procedure: ROBOTIC ASSISTED TOTAL HYSTERECTOMY WITH BILATERAL SALPINGO OOPHORECTOMY;  Surgeon: Everitt Amber, MD;  Location: [facility];  Service: Gynecology;  Laterality: Bilateral;  . SENTINEL NODE BIOPSY Bilateral 01/25/2017   Procedure: SENTINEL NODE BIOPSY;  Surgeon: Everitt Amber, MD;  Location: [facility];  Service: Gynecology;  Laterality: Bilateral;   Social History   Occupational History  . Not on file  Tobacco Use  . Smoking status: Current Every Day Smoker    Packs/day: 0.50    Years: 37.00    Pack years: 18.50  . Smokeless tobacco: Never Used  Substance and Sexual Activity  . Alcohol use: No    Alcohol/week: 0.0 oz  . Drug use: No  . Sexual activity: Not on file

## 2018-05-27 ENCOUNTER — Telehealth: Payer: Self-pay | Admitting: *Deleted

## 2018-05-27 NOTE — Telephone Encounter (Addendum)
Patient called and scheduled appt for pain, not going away. Saw Dr.Ross in late October, was told it may be a pulled muscle.

## 2018-06-10 ENCOUNTER — Inpatient Hospital Stay: Payer: BLUE CROSS/BLUE SHIELD | Attending: Gynecologic Oncology | Admitting: Gynecologic Oncology

## 2018-06-10 ENCOUNTER — Encounter: Payer: Self-pay | Admitting: Gynecologic Oncology

## 2018-06-10 VITALS — BP 124/74 | HR 74 | Temp 98.0°F | Resp 16 | Ht 63.0 in | Wt 174.0 lb

## 2018-06-10 DIAGNOSIS — C541 Malignant neoplasm of endometrium: Secondary | ICD-10-CM

## 2018-06-10 DIAGNOSIS — Z90710 Acquired absence of both cervix and uterus: Secondary | ICD-10-CM | POA: Diagnosis not present

## 2018-06-10 DIAGNOSIS — F17210 Nicotine dependence, cigarettes, uncomplicated: Secondary | ICD-10-CM

## 2018-06-10 DIAGNOSIS — Z90722 Acquired absence of ovaries, bilateral: Secondary | ICD-10-CM | POA: Insufficient documentation

## 2018-06-10 NOTE — Patient Instructions (Signed)
Dr Denman George noted a normal examination today, however is recommending a CT scan to better look at the potential source of your pain.  Presuming this is normal, she has scheduled you to see her again in May, 2020.   Please notify Dr Denman George at phone number [phone] if you notice vaginal bleeding, new pelvic or abdominal pains, bloating, feeling full easy, or a change in bladder or bowel function.

## 2018-06-10 NOTE — Progress Notes (Signed)
Follow-up Note: Gyn-Onc  Consult was requested by Dr. Harrington Challenger for the evaluation of Renee Anderson 58 y.o. female  CC:  Chief Complaint  Patient presents with  . Endometrial cancer [facility])    Assessment/Plan:  Renee Anderson  is a 58 y.o.  year old with stage IA grade 1 endometrial cancer. MSI stable.   Pathology revealed low risk factors for recurrence, therefore no adjuvant therapy was recommended according to NCCN guidelines.  New abdominal (left pelvic) symptoms concerning for recurrence. Normal exam. Will evaluate with CT abd/pelvis.   I discussed risk for recurrence and typical symptoms encouraged her to notify us of these should they develop between visits.  I recommend she continue to have follow-up every 6 months for 5 years in accordance with NCCN guidelines. She will alternate these visits with Dr Harrington Challenger and myself. Those visits should include symptom assessment, physical exam and pelvic examination. Pap smears are not indicated or recommended in the routine surveillance of endometrial cancer.  HPI: Renee Anderson is a 58 year old G2P2 who is seen in consultation at the request for Dr Renee Anderson for grade 1 endometrial cancer. The patient has a history of abnormal uterine bleeding (remote) that has been well controlled with a Mirena IUD for many years (most recent one placed February, 2014). She began developing new vaginal spotting in May 2018 and was seen by Dr Harrington Challenger who performed an [place] on 12/25/16. This revealed a uterus measuring 5.4x4x4cm and a thickened stripe of 4cm. The adnexa were normal.  An office endometrial biopsy was performed which revealed grade 1 endometrioid endometrial cancer.  The patient's only family history is a paternal aunt with lung cancer (smoker). Her only prior surgeries were c/s x 2 and open cholecystectomy. She is otherwise very healthy.   Interval Hx:  On 01/25/17 she underwent robotic assisted total hysterectomy, BSO, and SLN biopsy. Final  pathology revealed a 3cm grade 1 endometrioid adenocarcinoma with 0.5 of 1.7cm myometrial invasion, no LVSI and negative cervix, adnexae and nodes. MSI stable. She was staged as stage IA grade 1 endometrial cancer with a recommendation for close surveillance in accordance with NCCN guidelines.  Since August, 2019 she experienced new daily pains in the left pelvis, not associated with BM's, passage of flatus, bleeding, bladder voiding. They were new as persistent.   Current Meds:  Outpatient Encounter Medications as of 06/10/2018  Medication Sig  . Biotin 1 MG CAPS Take by mouth. Unsure of exact Mg cap  . cetirizine (ZYRTEC) 10 MG tablet Take 1 tablet (10 mg total) by mouth daily. (Patient taking differently: Take 10 mg by mouth daily as needed for allergies. )  . cyanocobalamin (,VITAMIN B-12,) 1000 MCG/ML injection Inject 1 mL (1,000 mcg total) into the muscle every 14 (fourteen) days.  . fluticasone (FLONASE) 50 MCG/ACT nasal spray Place 2 sprays into both nostrils daily. (Patient taking differently: Place 2 sprays into both nostrils daily as needed for allergies. )  . folic acid (FOLVITE) 1 MG tablet Take 1 mg by mouth daily. Unsure of exact Mg amount  . furosemide (LASIX) 20 MG tablet Take 20 mg by mouth daily as needed for edema.   . Guaifenesin (MUCINEX MAXIMUM STRENGTH) 1200 MG TB12 Take 1 tablet (1,200 mg total) by mouth every 12 (twelve) hours as needed.  Marland Kitchen ibuprofen (ADVIL,MOTRIN) 200 MG tablet Take 200 mg by mouth every 6 (six) hours as needed for mild pain.  Marland Kitchen ipratropium (ATROVENT) 0.03 % nasal spray Place 2 sprays  into both nostrils 2 (two) times daily. (Patient taking differently: Place 2 sprays into both nostrils 2 (two) times daily as needed for rhinitis. )  . prenatal vitamin w/FE, FA (PRENATAL 1 + 1) 27-1 MG TABS TAKE ONE TABLET BY MOUTH EVERY DAY  . [DISCONTINUED] terbinafine (LAMISIL) 250 MG tablet Take 250 mg by mouth every other day.   No facility-administered encounter  medications on file as of 06/10/2018.     Allergy:  Allergies  Allergen Reactions  . Bee Venom Anaphylaxis and Hives    Social Hx:   Social History   Socioeconomic History  . Marital status: Divorced    Spouse name: Not on file  . Number of children: Not on file  . Years of education: Not on file  . Highest education level: Not on file  Occupational History  . Not on file  Social Needs  . Financial resource strain: Not on file  . Food insecurity:    Worry: Not on file    Inability: Not on file  . Transportation needs:    Medical: Not on file    Non-medical: Not on file  Tobacco Use  . Smoking status: Current Every Day Smoker    Packs/day: 0.25    Years: 37.00    Pack years: 9.25  . Smokeless tobacco: Never Used  Substance and Sexual Activity  . Alcohol use: No    Alcohol/week: 0.0 standard drinks  . Drug use: No  . Sexual activity: Not on file  Lifestyle  . Physical activity:    Days per week: Not on file    Minutes per session: Not on file  . Stress: Not on file  Relationships  . Social connections:    Talks on phone: Not on file    Gets together: Not on file    Attends religious service: Not on file    Active member of club or organization: Not on file    Attends meetings of clubs or organizations: Not on file    Relationship status: Not on file  . Intimate partner violence:    Fear of current or ex partner: Not on file    Emotionally abused: Not on file    Physically abused: Not on file    Forced sexual activity: Not on file  Other Topics Concern  . Not on file  Social History Narrative  . Not on file    Past Surgical Hx:  Past Surgical History:  Procedure Laterality Date  . CESAREAN SECTION     x2 1996, 1986  . CHOLECYSTECTOMY    . MOHS SURGERY    . ROBOTIC ASSISTED TOTAL HYSTERECTOMY WITH BILATERAL SALPINGO OOPHERECTOMY Bilateral 01/25/2017   Procedure: ROBOTIC ASSISTED TOTAL HYSTERECTOMY WITH BILATERAL SALPINGO OOPHORECTOMY;  Surgeon: Everitt Amber, MD;  Location: [facility];  Service: Gynecology;  Laterality: Bilateral;  . SENTINEL NODE BIOPSY Bilateral 01/25/2017   Procedure: SENTINEL NODE BIOPSY;  Surgeon: Everitt Amber, MD;  Location: [facility];  Service: Gynecology;  Laterality: Bilateral;    Past Medical Hx:  Past Medical History:  Diagnosis Date  . Allergy   . Basal cell carcinoma    Endometrial  . Pernicious anemia   . Seasonal allergies     Past Gynecological History:  Abnormal uterine bleeding, c/s x 2. No LMP recorded. (Menstrual status: IUD).  Family Hx:  Family History  Problem Relation Age of Onset  . Cancer Paternal Aunt   . Breast cancer Paternal Aunt        ?  age of onset    Review of Systems:  Constitutional  Feels well,    ENT Normal appearing ears and nares bilaterally Skin/Breast  No rash, sores, jaundice, itching, dryness Cardiovascular  No chest pain, shortness of breath, or edema  Pulmonary  No cough or wheeze.  Gastro Intestinal  No nausea, vomitting, or diarrhoea. No bright red blood per rectum, no abdominal pain, change in bowel movement, or constipation.  Genito Urinary  No frequency, urgency, dysuria, + left pelvic pain Musculo Skeletal  No myalgia, arthralgia, joint swelling or pain  Neurologic  No weakness, numbness, change in gait,  Psychology  No depression, anxiety, insomnia.   Vitals:  Blood pressure 124/74, pulse 74, temperature 98 F (36.7 C), temperature source Oral, resp. rate 16, height 5' 3"  (1.6 m), weight 174 lb (78.9 kg), SpO2 100 %.  Physical Exam: WD in NAD Neck  Supple NROM, without any enlargements.  Lymph Node Survey No cervical supraclavicular or inguinal adenopathy Cardiovascular  Pulse normal rate, regularity and rhythm. S1 and S2 normal.  Lungs  Clear to auscultation bilateraly, without wheezes/crackles/rhonchi. Good air movement.  Skin  No rash/lesions/breakdown  Psychiatry  Alert and oriented to person, place, and time  Abdomen  Normoactive bowel  sounds, abdomen soft, non-tender and overweight without evidence of hernia. Incisions are well healed, no palpable masses. Back No CVA tenderness Genito Urinary  Vulva/vagina: vaginal cuff, smooth tissues, no masses or lesions.  No masses or lesions visible or palpable.  Rectal  No palpable lesions or masses Extremities  No bilateral cyanosis, clubbing or edema.  Thereasa Solo, MD  06/10/2018, 10:46 AM

## 2018-06-18 ENCOUNTER — Telehealth: Payer: Self-pay | Admitting: *Deleted

## 2018-06-18 ENCOUNTER — Other Ambulatory Visit: Payer: Self-pay | Admitting: Gynecologic Oncology

## 2018-06-18 ENCOUNTER — Telehealth: Payer: Self-pay

## 2018-06-18 ENCOUNTER — Ambulatory Visit (HOSPITAL_COMMUNITY): Admission: RE | Admit: 2018-06-18 | Payer: BLUE CROSS/BLUE SHIELD | Source: Ambulatory Visit

## 2018-06-18 DIAGNOSIS — C541 Malignant neoplasm of endometrium: Secondary | ICD-10-CM

## 2018-06-18 NOTE — Telephone Encounter (Signed)
Pt called earlier and requested that the CT be cancelled at [facility] and ordered for [facility] because her out of pocket cost will be cheaper at [facility] per patient.  Notified Joylene John NP and new order placed.  Notified Brandi Harvell for the pre-auth.  Appt made for 1/16 at 3 pm- arrive 2:45 pm at [facility]- outgoing call to patient - voiced understanding of new appt- she has the contrast and instructions.  No other needs per pt at this time.

## 2018-06-18 NOTE — Progress Notes (Signed)
Patient wants to have CT at [facility] bc it will be $300 cheaper

## 2018-06-18 NOTE — Telephone Encounter (Signed)
Fax the auth to [facility]

## 2018-06-20 ENCOUNTER — Ambulatory Visit
Admission: RE | Admit: 2018-06-20 | Discharge: 2018-06-20 | Disposition: A | Discharge status: Home / Self Care | Payer: BLUE CROSS/BLUE SHIELD | Source: Ambulatory Visit | Attending: Gynecologic Oncology | Admitting: Gynecologic Oncology

## 2018-06-20 DIAGNOSIS — C541 Malignant neoplasm of endometrium: Secondary | ICD-10-CM

## 2018-06-20 MED ORDER — IOPAMIDOL (ISOVUE-300) INJECTION 61%
100.0000 mL | Freq: Once | INTRAVENOUS | Status: AC | PRN
  Administered 2018-06-20: 100 mL via INTRAVENOUS

## 2018-06-21 ENCOUNTER — Telehealth: Payer: Self-pay

## 2018-06-21 NOTE — Telephone Encounter (Signed)
Out going call to patient per Renee John NP- regarding results of CT abd/pelvis as " nothing to explain pain, no evidence of recurrence"  Pt voiced understanding.  Reported pain as not that bad, just wanted to get checked out- I encouraged pt to call her PCP if pain persists as they can assess further.  Pt voiced understanding and no other needs at this time.

## 2018-09-11 ENCOUNTER — Telehealth: Payer: Self-pay | Admitting: *Deleted

## 2018-09-11 NOTE — Telephone Encounter (Signed)
Patient called and requested her appt date/time. Due to COVID-19, patient rescheduled her appt from May to June

## 2018-10-11 ENCOUNTER — Ambulatory Visit: Payer: BLUE CROSS/BLUE SHIELD | Admitting: Gynecologic Oncology

## 2018-11-13 ENCOUNTER — Inpatient Hospital Stay: Payer: BC Managed Care – PPO | Attending: Gynecologic Oncology | Admitting: Gynecologic Oncology

## 2018-11-13 ENCOUNTER — Encounter: Payer: Self-pay | Admitting: Gynecologic Oncology

## 2018-11-13 ENCOUNTER — Other Ambulatory Visit: Payer: Self-pay

## 2018-11-13 VITALS — BP 124/65 | HR 61 | Temp 98.1°F | Resp 18 | Ht 63.0 in | Wt 186.2 lb

## 2018-11-13 DIAGNOSIS — Z90710 Acquired absence of both cervix and uterus: Secondary | ICD-10-CM | POA: Insufficient documentation

## 2018-11-13 DIAGNOSIS — F17210 Nicotine dependence, cigarettes, uncomplicated: Secondary | ICD-10-CM | POA: Diagnosis not present

## 2018-11-13 DIAGNOSIS — Z90722 Acquired absence of ovaries, bilateral: Secondary | ICD-10-CM | POA: Diagnosis not present

## 2018-11-13 DIAGNOSIS — C541 Malignant neoplasm of endometrium: Secondary | ICD-10-CM | POA: Diagnosis not present

## 2018-11-13 NOTE — Patient Instructions (Signed)
Plan to follow up in the winter with Dr. Harrington Challenger and in the spring with Dr. Denman George. Please call our office after you have seen Dr. Harrington Challenger to schedule your appointment with Dr. Denman George at [phone].

## 2018-11-13 NOTE — Progress Notes (Signed)
Follow-up Note: Gyn-Onc  Consult was requested by Dr. Harrington Challenger for the evaluation of Renee Anderson 58 y.o. female  CC:  Chief Complaint  Patient presents with  . Endometrial cancer [facility])    Assessment/Plan:  Ms. Renee Anderson  is a 58 y.o.  year old with stage IA grade 1 endometrial cancer. MSI stable.   Pathology revealed low risk factors for recurrence, therefore no adjuvant therapy was recommended according to NCCN guidelines.  I discussed risk for recurrence and typical symptoms encouraged her to notify us of these should they develop between visits.  We discussed survivorship issues including:  non-gyn cancer screening long-term surgery related side-effects  I recommend she continue to have follow-up every 6 months for 5 years in accordance with NCCN guidelines. She will alternate these visits with Dr Harrington Challenger and myself. Those visits should include symptom assessment, physical exam and pelvic examination. Pap smears are not indicated or recommended in the routine surveillance of endometrial cancer.  HPI: Renee Anderson is a 59 year old G2P2 who is seen in consultation at the request for Dr Vanessa Kick for grade 1 endometrial cancer. The patient has a history of abnormal uterine bleeding (remote) that has been well controlled with a Mirena IUD for many years (most recent one placed February, 2014). She began developing new vaginal spotting in May 2018 and was seen by Dr Harrington Challenger who performed an [place] on 12/25/16. This revealed a uterus measuring 5.4x4x4cm and a thickened stripe of 4cm. The adnexa were normal.  An office endometrial biopsy was performed which revealed grade 1 endometrioid endometrial cancer.  The patient's only family history is a paternal aunt with lung cancer (smoker). Her only prior surgeries were c/s x 2 and open cholecystectomy. She is otherwise very healthy.   On 01/25/17 she underwent robotic assisted total hysterectomy, BSO, and SLN biopsy. Final pathology revealed a  3cm grade 1 endometrioid adenocarcinoma with 0.5 of 1.7cm myometrial invasion, no LVSI and negative cervix, adnexae and nodes. MSI stable. She was staged as stage IA grade 1 endometrial cancer with a recommendation for close surveillance in accordance with NCCN guidelines.  In August, 2019 she experienced new daily pains in the left pelvis, not associated with BM's, passage of flatus, bleeding, bladder voiding.  CT scan of the abdomen and pelvis was ordered on 06/20/18 which showed nothing to explain the pain and no evidence of recurrence.   Interval Hx:   She has no symptoms of recurrence, the left abdominal pain is improved.  Current Meds:  Outpatient Encounter Medications as of 11/13/2018  Medication Sig  . cetirizine (ZYRTEC) 10 MG tablet Take 1 tablet (10 mg total) by mouth daily. (Patient taking differently: Take 10 mg by mouth daily as needed for allergies. )  . furosemide (LASIX) 20 MG tablet Take 20 mg by mouth daily as needed for edema.   Marland Kitchen ibuprofen (ADVIL,MOTRIN) 200 MG tablet Take 200 mg by mouth every 6 (six) hours as needed for mild pain.  Marland Kitchen ipratropium (ATROVENT) 0.03 % nasal spray Place 2 sprays into both nostrils 2 (two) times daily. (Patient taking differently: Place 2 sprays into both nostrils 2 (two) times daily as needed for rhinitis. )  . prenatal vitamin w/FE, FA (PRENATAL 1 + 1) 27-1 MG TABS TAKE ONE TABLET BY MOUTH EVERY DAY  . cyanocobalamin (,VITAMIN B-12,) 1000 MCG/ML injection Inject 1 mL (1,000 mcg total) into the muscle every 14 (fourteen) days. (Patient not taking: Reported on 11/13/2018)  . [DISCONTINUED] Biotin 1  MG CAPS Take by mouth. Unsure of exact Mg cap  . [DISCONTINUED] fluticasone (FLONASE) 50 MCG/ACT nasal spray Place 2 sprays into both nostrils daily. (Patient taking differently: Place 2 sprays into both nostrils daily as needed for allergies. )  . [DISCONTINUED] folic acid (FOLVITE) 1 MG tablet Take 1 mg by mouth daily. Unsure of exact Mg amount  .  [DISCONTINUED] Guaifenesin (MUCINEX MAXIMUM STRENGTH) 1200 MG TB12 Take 1 tablet (1,200 mg total) by mouth every 12 (twelve) hours as needed.   No facility-administered encounter medications on file as of 11/13/2018.     Allergy:  Allergies  Allergen Reactions  . Bee Venom Anaphylaxis and Hives    Social Hx:   Social History   Socioeconomic History  . Marital status: Divorced    Spouse name: Not on file  . Number of children: Not on file  . Years of education: Not on file  . Highest education level: Not on file  Occupational History  . Not on file  Social Needs  . Financial resource strain: Not on file  . Food insecurity:    Worry: Not on file    Inability: Not on file  . Transportation needs:    Medical: Not on file    Non-medical: Not on file  Tobacco Use  . Smoking status: Current Every Day Smoker    Packs/day: 0.25    Years: 37.00    Pack years: 9.25  . Smokeless tobacco: Never Used  Substance and Sexual Activity  . Alcohol use: No    Alcohol/week: 0.0 standard drinks  . Drug use: No  . Sexual activity: Not on file  Lifestyle  . Physical activity:    Days per week: Not on file    Minutes per session: Not on file  . Stress: Not on file  Relationships  . Social connections:    Talks on phone: Not on file    Gets together: Not on file    Attends religious service: Not on file    Active member of club or organization: Not on file    Attends meetings of clubs or organizations: Not on file    Relationship status: Not on file  . Intimate partner violence:    Fear of current or ex partner: Not on file    Emotionally abused: Not on file    Physically abused: Not on file    Forced sexual activity: Not on file  Other Topics Concern  . Not on file  Social History Narrative  . Not on file    Past Surgical Hx:  Past Surgical History:  Procedure Laterality Date  . CESAREAN SECTION     x2 1996, 1986  . CHOLECYSTECTOMY    . MOHS SURGERY    . ROBOTIC ASSISTED  TOTAL HYSTERECTOMY WITH BILATERAL SALPINGO OOPHERECTOMY Bilateral 01/25/2017   Procedure: ROBOTIC ASSISTED TOTAL HYSTERECTOMY WITH BILATERAL SALPINGO OOPHORECTOMY;  Surgeon: Everitt Amber, MD;  Location: [facility];  Service: Gynecology;  Laterality: Bilateral;  . SENTINEL NODE BIOPSY Bilateral 01/25/2017   Procedure: SENTINEL NODE BIOPSY;  Surgeon: Everitt Amber, MD;  Location: [facility];  Service: Gynecology;  Laterality: Bilateral;    Past Medical Hx:  Past Medical History:  Diagnosis Date  . Allergy   . Basal cell carcinoma    Endometrial  . Pernicious anemia   . Seasonal allergies     Past Gynecological History:  Abnormal uterine bleeding, c/s x 2. No LMP recorded. (Menstrual status: IUD).  Family Hx:  Family History  Problem Relation Age of Onset  . Cancer Paternal Aunt   . Breast cancer Paternal Aunt        ? age of onset    Review of Systems:  Constitutional  Feels well,    ENT Normal appearing ears and nares bilaterally Skin/Breast  No rash, sores, jaundice, itching, dryness Cardiovascular  No chest pain, shortness of breath, or edema  Pulmonary  No cough or wheeze.  Gastro Intestinal  No nausea, vomitting, or diarrhoea. No bright red blood per rectum, no abdominal pain, change in bowel movement, or constipation.  Genito Urinary  No frequency, urgency, dysuria, + left pelvic pain Musculo Skeletal  No myalgia, arthralgia, joint swelling or pain  Neurologic  No weakness, numbness, change in gait,  Psychology  No depression, anxiety, insomnia.   Vitals:  Blood pressure 124/65, pulse 61, temperature 98.1 F (36.7 C), temperature source Oral, resp. rate 18, height 5' 3" (1.6 m), weight 186 lb 3.2 oz (84.5 kg), SpO2 100 %.  Physical Exam: WD in NAD Neck  Supple NROM, without any enlargements.  Lymph Node Survey No cervical supraclavicular or inguinal adenopathy Cardiovascular  Pulse normal rate, regularity and rhythm. S1 and S2 normal.  Lungs  Clear to auscultation  bilateraly, without wheezes/crackles/rhonchi. Good air movement.  Skin  No rash/lesions/breakdown  Psychiatry  Alert and oriented to person, place, and time  Abdomen  Normoactive bowel sounds, abdomen soft, non-tender and overweight without evidence of hernia. Incisions are well healed, no palpable masses. Back No CVA tenderness Genito Urinary  Vulva/vagina: vaginal cuff, smooth tissues, no masses or lesions.  No masses or lesions visible or palpable.  Rectal  No palpable lesions or masses Extremities  No bilateral cyanosis, clubbing or edema.  Thereasa Solo, MD  11/13/2018, 9:46 AM

## 2019-01-15 ENCOUNTER — Other Ambulatory Visit: Payer: Self-pay | Admitting: Obstetrics and Gynecology

## 2019-01-15 DIAGNOSIS — Z1231 Encounter for screening mammogram for malignant neoplasm of breast: Secondary | ICD-10-CM

## 2019-02-27 ENCOUNTER — Other Ambulatory Visit: Payer: Self-pay

## 2019-02-27 ENCOUNTER — Ambulatory Visit
Admission: RE | Admit: 2019-02-27 | Discharge: 2019-02-27 | Disposition: A | Discharge status: Home / Self Care | Payer: BC Managed Care – PPO | Source: Ambulatory Visit | Attending: Obstetrics and Gynecology | Admitting: Obstetrics and Gynecology

## 2019-02-27 DIAGNOSIS — Z1231 Encounter for screening mammogram for malignant neoplasm of breast: Secondary | ICD-10-CM

## 2020-03-19 ENCOUNTER — Other Ambulatory Visit: Payer: Self-pay | Admitting: Obstetrics and Gynecology

## 2020-03-19 DIAGNOSIS — Z0000 Encounter for general adult medical examination without abnormal findings: Secondary | ICD-10-CM

## 2020-04-14 ENCOUNTER — Ambulatory Visit
Admission: RE | Admit: 2020-04-14 | Discharge: 2020-04-14 | Disposition: A | Discharge status: Home / Self Care | Payer: BC Managed Care – PPO | Source: Ambulatory Visit | Attending: Obstetrics and Gynecology | Admitting: Obstetrics and Gynecology

## 2020-04-14 ENCOUNTER — Other Ambulatory Visit: Payer: Self-pay

## 2020-04-14 DIAGNOSIS — Z0000 Encounter for general adult medical examination without abnormal findings: Secondary | ICD-10-CM

## 2020-04-20 ENCOUNTER — Other Ambulatory Visit: Payer: Self-pay | Admitting: Obstetrics and Gynecology

## 2020-04-20 DIAGNOSIS — R928 Other abnormal and inconclusive findings on diagnostic imaging of breast: Secondary | ICD-10-CM

## 2020-05-03 ENCOUNTER — Other Ambulatory Visit: Payer: BC Managed Care – PPO

## 2020-05-04 ENCOUNTER — Other Ambulatory Visit: Payer: Self-pay

## 2020-05-04 ENCOUNTER — Ambulatory Visit
Admission: RE | Admit: 2020-05-04 | Discharge: 2020-05-04 | Disposition: A | Discharge status: Home / Self Care | Payer: BC Managed Care – PPO | Source: Ambulatory Visit | Attending: Obstetrics and Gynecology | Admitting: Obstetrics and Gynecology

## 2020-05-04 DIAGNOSIS — R928 Other abnormal and inconclusive findings on diagnostic imaging of breast: Secondary | ICD-10-CM

## 2020-10-15 ENCOUNTER — Telehealth: Payer: Self-pay | Admitting: *Deleted

## 2020-10-15 NOTE — Telephone Encounter (Signed)
Ms. Sakamoto call back to schedule her appt. Date ans time confirmed with her

## 2020-10-15 NOTE — Telephone Encounter (Signed)
Attempted to return the patient's call, left a message to call the office back to schedule a follow up appt

## 2020-11-05 ENCOUNTER — Encounter: Payer: Self-pay | Admitting: Gynecologic Oncology

## 2020-11-08 ENCOUNTER — Other Ambulatory Visit: Payer: Self-pay

## 2020-11-08 ENCOUNTER — Inpatient Hospital Stay: Payer: No Typology Code available for payment source | Attending: Gynecologic Oncology | Admitting: Gynecologic Oncology

## 2020-11-08 VITALS — BP 113/71 | HR 82 | Temp 97.7°F | Ht 63.0 in | Wt 200.8 lb

## 2020-11-08 DIAGNOSIS — Z90722 Acquired absence of ovaries, bilateral: Secondary | ICD-10-CM | POA: Insufficient documentation

## 2020-11-08 DIAGNOSIS — C541 Malignant neoplasm of endometrium: Secondary | ICD-10-CM

## 2020-11-08 DIAGNOSIS — F17210 Nicotine dependence, cigarettes, uncomplicated: Secondary | ICD-10-CM | POA: Diagnosis not present

## 2020-11-08 DIAGNOSIS — Z79899 Other long term (current) drug therapy: Secondary | ICD-10-CM | POA: Diagnosis not present

## 2020-11-08 DIAGNOSIS — Z90710 Acquired absence of both cervix and uterus: Secondary | ICD-10-CM | POA: Diagnosis not present

## 2020-11-08 NOTE — Progress Notes (Signed)
Follow-up Note: Gyn-Onc  Consult was requested by Dr. Harrington Challenger for the evaluation of ONIYAH ROHE 60 y.o. female  CC:  Chief Complaint  Patient presents with  . Endometrial cancer [facility])    Assessment/Plan:  Ms. LILIAUNA SANTONI  is a 60 y.o.  year old with stage IA grade 1 endometrial cancer. MSI stable. D/p dyshinh on 01/25/17.   Pathology revealed low risk factors for recurrence, therefore no adjuvant therapy was recommended according to NCCN guidelines.  I discussed risk for recurrence and typical symptoms encouraged her to notify us of these should they develop between visits.  I recommend she continue to have follow-up every 6 months for 5 years (August, 2023) in accordance with NCCN guidelines. She will alternate these visits with Dr Harrington Challenger and myself. Those visits should include symptom assessment, physical exam and pelvic examination. Pap smears are not indicated or recommended in the routine surveillance of endometrial cancer.  HPI: Earlie Arciga is a 60 year old G2P2 who is seen in consultation at the request for Dr Vanessa Kick for grade 1 endometrial cancer. The patient has a history of abnormal uterine bleeding (remote) that has been well controlled with a Mirena IUD for many years (most recent one placed February, 2014). She began developing new vaginal spotting in May 2018 and was seen by Dr Harrington Challenger who performed an [place] on 12/25/16. This revealed a uterus measuring 5.4x4x4cm and a thickened stripe of 4cm. The adnexa were normal.  An office endometrial biopsy was performed which revealed grade 1 endometrioid endometrial cancer.  On 01/25/17 she underwent robotic assisted total hysterectomy, BSO, and SLN biopsy. Final pathology revealed a 3cm grade 1 endometrioid adenocarcinoma with 0.5 of 1.7cm myometrial invasion, no LVSI and negative cervix, adnexae and nodes. MSI stable. She was staged as stage IA grade 1 endometrial cancer with a recommendation for close surveillance in accordance  with NCCN guidelines.  In August, 2019 she experienced new daily pains in the left pelvis, not associated with BM's, passage of flatus, bleeding, bladder voiding.  CT scan of the abdomen and pelvis was ordered on 06/20/18 which showed nothing to explain the pain and no evidence of recurrence.   Interval Hx:  She has no symptoms of recurrence, the left abdominal pain is improved.  Current Meds:  Outpatient Encounter Medications as of 11/08/2020  Medication Sig  . cetirizine (ZYRTEC) 10 MG tablet Take 1 tablet (10 mg total) by mouth daily. (Patient taking differently: Take 10 mg by mouth daily as needed for allergies.)  . cyanocobalamin (,VITAMIN B-12,) 1000 MCG/ML injection Inject 1 mL (1,000 mcg total) into the muscle every 14 (fourteen) days.  . Multiple Vitamin (MULTIVITAMIN) tablet Take 1 tablet by mouth daily. Level Thrive  . EPINEPHrine 0.3 mg/0.3 mL IJ SOAJ injection Inject into the muscle. (Patient not taking: Reported on 11/05/2020)  . fluticasone (FLONASE) 50 MCG/ACT nasal spray fluticasone propionate 50 mcg/actuation nasal spray,suspension  USE 2 SPRAY(S) IN EACH NOSTRIL ONCE DAILY (Patient not taking: Reported on 11/05/2020)  . furosemide (LASIX) 20 MG tablet Take 20 mg by mouth daily as needed for edema.  (Patient not taking: Reported on 11/05/2020)  . hydrochlorothiazide (HYDRODIURIL) 25 MG tablet Take 25 mg by mouth as needed. (Patient not taking: Reported on 11/05/2020)  . ibuprofen (ADVIL,MOTRIN) 200 MG tablet Take 200 mg by mouth every 6 (six) hours as needed for mild pain. (Patient not taking: Reported on 11/05/2020)  . ipratropium (ATROVENT) 0.03 % nasal spray Place 2 sprays into both nostrils  2 (two) times daily. (Patient not taking: Reported on 11/05/2020)  . prenatal vitamin w/FE, FA (PRENATAL 1 + 1) 27-1 MG TABS TAKE ONE TABLET BY MOUTH EVERY DAY (Patient not taking: Reported on 11/05/2020)   No facility-administered encounter medications on file as of 11/08/2020.    Allergy:   Allergies  Allergen Reactions  . Bee Venom Anaphylaxis and Hives    Social Hx:   Social History   Socioeconomic History  . Marital status: Divorced    Spouse name: Not on file  . Number of children: Not on file  . Years of education: Not on file  . Highest education level: Not on file  Occupational History  . Not on file  Tobacco Use  . Smoking status: Current Every Day Smoker    Packs/day: 0.50    Years: 37.00    Pack years: 18.50    Types: Cigarettes  . Smokeless tobacco: Never Used  Vaping Use  . Vaping Use: Never used  Substance and Sexual Activity  . Alcohol use: No    Alcohol/week: 0.0 standard drinks  . Drug use: No  . Sexual activity: Not on file  Other Topics Concern  . Not on file  Social History Narrative  . Not on file   Social Determinants of Health   Financial Resource Strain: Not on file  Food Insecurity: Not on file  Transportation Needs: Not on file  Physical Activity: Not on file  Stress: Not on file  Social Connections: Not on file  Intimate Partner Violence: Not on file    Past Surgical Hx:  Past Surgical History:  Procedure Laterality Date  . CESAREAN SECTION     x2 1996, 1986  . CHOLECYSTECTOMY    . MOHS SURGERY    . ROBOTIC ASSISTED TOTAL HYSTERECTOMY WITH BILATERAL SALPINGO OOPHERECTOMY Bilateral 01/25/2017   Procedure: ROBOTIC ASSISTED TOTAL HYSTERECTOMY WITH BILATERAL SALPINGO OOPHORECTOMY;  Surgeon: Everitt Amber, MD;  Location: [facility];  Service: Gynecology;  Laterality: Bilateral;  . SENTINEL NODE BIOPSY Bilateral 01/25/2017   Procedure: SENTINEL NODE BIOPSY;  Surgeon: Everitt Amber, MD;  Location: [facility];  Service: Gynecology;  Laterality: Bilateral;    Past Medical Hx:  Past Medical History:  Diagnosis Date  . Allergy   . Basal cell carcinoma    Endometrial  . Pernicious anemia   . Seasonal allergies     Past Gynecological History:  Abnormal uterine bleeding, c/s x 2. No LMP recorded. (Menstrual status: IUD).  Family Hx:   Family History  Problem Relation Age of Onset  . Cancer Paternal Aunt   . Breast cancer Paternal Aunt        ? age of onset    Review of Systems:  Constitutional  Feels well,    ENT Normal appearing ears and nares bilaterally Skin/Breast  No rash, sores, jaundice, itching, dryness Cardiovascular  No chest pain, shortness of breath, or edema  Pulmonary  No cough or wheeze.  Gastro Intestinal  No nausea, vomitting, or diarrhoea. No bright red blood per rectum, no abdominal pain, change in bowel movement, or constipation.  Genito Urinary  No frequency, urgency, dysuria, + left pelvic pain Musculo Skeletal  No myalgia, arthralgia, joint swelling or pain  Neurologic  No weakness, numbness, change in gait,  Psychology  No depression, anxiety, insomnia.   Vitals:  There were no vitals taken for this visit.  Physical Exam: WD in NAD Neck  Supple NROM, without any enlargements.  Lymph Node Survey No cervical supraclavicular or  inguinal adenopathy Cardiovascular  Pulse normal rate, regularity and rhythm. S1 and S2 normal.  Lungs  Clear to auscultation bilateraly, without wheezes/crackles/rhonchi. Good air movement.  Skin  No rash/lesions/breakdown  Psychiatry  Alert and oriented to person, place, and time  Abdomen  Normoactive bowel sounds, abdomen soft, non-tender and overweight without evidence of hernia. Incisions are well healed, no palpable masses. Back No CVA tenderness Genito Urinary  Vulva/vagina: vaginal cuff, smooth tissues, no masses or lesions.  No masses or lesions visible or palpable.  Rectal  No palpable lesions or masses Extremities  No bilateral cyanosis, clubbing or edema.  Thereasa Solo, MD  11/08/2020, 1:10 PM

## 2020-11-08 NOTE — Patient Instructions (Signed)
Please notify Dr Denman George at phone number [phone] if you notice vaginal bleeding, new pelvic or abdominal pains, bloating, feeling full easy, or a change in bladder or bowel function.   Please contact Dr Serita Grit office (at [phone]) in December to request an appointment with her for June, 2023. This will end your cancer surveillance.

## 2021-03-02 ENCOUNTER — Other Ambulatory Visit: Payer: Self-pay | Admitting: Obstetrics and Gynecology

## 2021-03-02 DIAGNOSIS — Z1231 Encounter for screening mammogram for malignant neoplasm of breast: Secondary | ICD-10-CM

## 2021-04-15 ENCOUNTER — Other Ambulatory Visit: Payer: Self-pay

## 2021-04-15 ENCOUNTER — Ambulatory Visit
Admission: RE | Admit: 2021-04-15 | Discharge: 2021-04-15 | Disposition: A | Discharge status: Home / Self Care | Payer: No Typology Code available for payment source | Source: Ambulatory Visit | Attending: Obstetrics and Gynecology | Admitting: Obstetrics and Gynecology

## 2021-04-15 DIAGNOSIS — Z1231 Encounter for screening mammogram for malignant neoplasm of breast: Secondary | ICD-10-CM

## 2021-10-27 ENCOUNTER — Telehealth: Payer: Self-pay | Admitting: *Deleted

## 2021-10-27 NOTE — Telephone Encounter (Signed)
Patient called and scheduled a follow up appt with Dr Delsa Sale for 7/19. Former Dr Denman George patient

## 2021-12-21 ENCOUNTER — Ambulatory Visit: Payer: No Typology Code available for payment source | Admitting: Obstetrics & Gynecology

## 2022-01-11 ENCOUNTER — Inpatient Hospital Stay
Payer: No Typology Code available for payment source | Attending: Obstetrics & Gynecology | Admitting: Obstetrics & Gynecology

## 2022-01-11 ENCOUNTER — Encounter: Payer: Self-pay | Admitting: Obstetrics & Gynecology

## 2022-01-11 ENCOUNTER — Other Ambulatory Visit: Payer: Self-pay

## 2022-01-11 VITALS — BP 117/64 | HR 85 | Temp 98.5°F | Resp 17 | Wt 217.0 lb

## 2022-01-11 DIAGNOSIS — Z8542 Personal history of malignant neoplasm of other parts of uterus: Secondary | ICD-10-CM | POA: Diagnosis present

## 2022-01-11 DIAGNOSIS — C541 Malignant neoplasm of endometrium: Secondary | ICD-10-CM

## 2022-01-11 NOTE — Assessment & Plan Note (Addendum)
Ms. Renee Anderson  is a 61 y.o.  year old with stage IA grade 1 endometrial cancer. Negative symptom review, normal exam.  No evidence of recurrence   >follow-up w/generalist gynecologist in 1 yr is appropriate

## 2022-01-11 NOTE — Progress Notes (Signed)
Follow Up Note: Gyn-Onc  Renee Anderson 61 y.o. female  CC: She returns for a f/u visit   HPI: The oncology history was reviewed.  Interval History: She denies any vaginal bleeding, abdominal/pelvic pain, cough, lethargy or increasing abdominal girth.   Review of Systems  Review of Systems  Constitutional:  Negative for malaise/fatigue and weight loss.  Respiratory:  Negative for shortness of breath and wheezing.   Cardiovascular:  Negative for chest pain and leg swelling.  Gastrointestinal:  Negative for abdominal pain, blood in stool, constipation, nausea and vomiting.  Genitourinary:  Negative for dysuria, frequency, hematuria and urgency.  Musculoskeletal:  Negative for joint pain and myalgias.  Neurological:  Negative for weakness.  Psychiatric/Behavioral:  Negative for depression. The patient does not have insomnia.    Current medications, allergy, social history, past surgical history, past medical history, family history were all reviewed.    Vitals:  BP 117/64 (BP Location: Left Arm, Patient Position: Sitting)   Pulse 85   Temp 98.5 F (36.9 C) (Oral)   Resp 17   Wt 217 lb (98.4 kg)   SpO2 99%   BMI 38.44 kg/m    Physical Exam:  Physical Exam Exam conducted with a chaperone present.  Constitutional:      General: She is not in acute distress. Cardiovascular:     Rate and Rhythm: Normal rate and regular rhythm.  Pulmonary:     Effort: Pulmonary effort is normal.     Breath sounds: Normal breath sounds. No wheezing or rhonchi.  Abdominal:     Palpations: Abdomen is soft.     Tenderness: There is no abdominal tenderness. There is no right CVA tenderness or left CVA tenderness.     Hernia: No hernia is present.  Genitourinary:    General: Normal vulva.     Urethra: No urethral lesion.     Vagina: No lesions. No bleeding Musculoskeletal:     Cervical back: Neck supple.     Right lower leg: No edema.     Left lower leg: No edema.  Lymphadenopathy:      Upper Body:     Right upper body: No supraclavicular adenopathy.     Left upper body: No supraclavicular adenopathy.     Lower Body: No right inguinal adenopathy. No left inguinal adenopathy.  Skin:    Findings: No rash.  Neurological:     Mental Status: She is oriented to person, place, and time.   Assessment/Plan:   Endometrial cancer [facility]) Ms. Renee Anderson  is a 61 y.o.  year old with stage IA grade 1 endometrial cancer. Negative symptom review, normal exam.  No evidence of recurrence    >follow-up w/generalist gynecologist in 1 yr is appropriate   I personally spent 25 minutes face-to-face and non-face-to-face in the care of this patient, which includes all pre, intra, and post visit time on the date of service.    Lahoma Crocker, MD

## 2022-01-11 NOTE — Patient Instructions (Signed)
HAPPY 7 YEAR GRADUATION!!

## 2022-02-07 ENCOUNTER — Other Ambulatory Visit: Payer: Self-pay | Admitting: Family Medicine

## 2022-02-07 DIAGNOSIS — Z139 Encounter for screening, unspecified: Secondary | ICD-10-CM

## 2022-04-19 ENCOUNTER — Ambulatory Visit
Admission: RE | Admit: 2022-04-19 | Discharge: 2022-04-19 | Disposition: A | Discharge status: Home / Self Care | Payer: No Typology Code available for payment source | Source: Ambulatory Visit | Attending: Family Medicine | Admitting: Family Medicine

## 2022-04-19 DIAGNOSIS — Z139 Encounter for screening, unspecified: Secondary | ICD-10-CM

## 2023-03-26 ENCOUNTER — Other Ambulatory Visit: Payer: Self-pay | Admitting: Family Medicine

## 2023-03-26 DIAGNOSIS — Z1231 Encounter for screening mammogram for malignant neoplasm of breast: Secondary | ICD-10-CM

## 2023-05-08 ENCOUNTER — Ambulatory Visit: Payer: No Typology Code available for payment source

## 2023-05-08 ENCOUNTER — Ambulatory Visit
Admission: RE | Admit: 2023-05-08 | Discharge: 2023-05-08 | Disposition: A | Discharge status: Home / Self Care | Payer: No Typology Code available for payment source | Source: Ambulatory Visit | Attending: Family Medicine | Admitting: Family Medicine

## 2023-05-08 DIAGNOSIS — Z1231 Encounter for screening mammogram for malignant neoplasm of breast: Secondary | ICD-10-CM

## 2023-05-11 ENCOUNTER — Other Ambulatory Visit: Payer: Self-pay | Admitting: Family Medicine

## 2023-05-11 DIAGNOSIS — R928 Other abnormal and inconclusive findings on diagnostic imaging of breast: Secondary | ICD-10-CM

## 2023-05-28 ENCOUNTER — Ambulatory Visit
Admission: RE | Admit: 2023-05-28 | Discharge: 2023-05-28 | Disposition: A | Discharge status: Home / Self Care | Payer: No Typology Code available for payment source | Source: Ambulatory Visit | Attending: Family Medicine | Admitting: Family Medicine

## 2023-05-28 DIAGNOSIS — R928 Other abnormal and inconclusive findings on diagnostic imaging of breast: Secondary | ICD-10-CM

## 2024-02-26 ENCOUNTER — Other Ambulatory Visit: Payer: Self-pay | Admitting: Obstetrics and Gynecology

## 2024-02-26 DIAGNOSIS — Z1231 Encounter for screening mammogram for malignant neoplasm of breast: Secondary | ICD-10-CM

## 2024-05-08 ENCOUNTER — Ambulatory Visit

## 2024-05-09 ENCOUNTER — Ambulatory Visit
Admission: RE | Admit: 2024-05-09 | Discharge: 2024-05-09 | Disposition: A | Source: Ambulatory Visit | Attending: Obstetrics and Gynecology | Admitting: Obstetrics and Gynecology

## 2024-05-09 DIAGNOSIS — Z1231 Encounter for screening mammogram for malignant neoplasm of breast: Secondary | ICD-10-CM
